# Patient Record
Sex: Male | Born: 1975 | Race: White | Hispanic: No | Marital: Single | Smoking: Current every day smoker
Health system: Southern US, Community
[De-identification: ages and names within clinical notes are randomized; demographics above are authoritative.]

## PROBLEM LIST (undated history)

## (undated) ENCOUNTER — Ambulatory Visit: Source: Home / Self Care

## (undated) DIAGNOSIS — M069 Rheumatoid arthritis, unspecified: Secondary | ICD-10-CM

## (undated) DIAGNOSIS — I499 Cardiac arrhythmia, unspecified: Secondary | ICD-10-CM

---

## 2008-12-03 ENCOUNTER — Emergency Department (HOSPITAL_COMMUNITY): Admission: EM | Admit: 2008-12-03 | Discharge: 2008-12-03 | Payer: Self-pay | Admitting: Emergency Medicine

## 2010-04-28 ENCOUNTER — Emergency Department (HOSPITAL_COMMUNITY): Admission: EM | Admit: 2010-04-28 | Discharge: 2010-04-28 | Payer: Self-pay | Admitting: Emergency Medicine

## 2011-10-27 ENCOUNTER — Emergency Department (HOSPITAL_COMMUNITY): Payer: Self-pay

## 2011-10-27 ENCOUNTER — Encounter (HOSPITAL_COMMUNITY): Payer: Self-pay

## 2011-10-27 ENCOUNTER — Emergency Department (HOSPITAL_COMMUNITY)
Admission: EM | Admit: 2011-10-27 | Discharge: 2011-10-27 | Disposition: A | Discharge status: Home / Self Care | Payer: Self-pay | Attending: Emergency Medicine | Admitting: Emergency Medicine

## 2011-10-27 DIAGNOSIS — IMO0001 Reserved for inherently not codable concepts without codable children: Secondary | ICD-10-CM | POA: Insufficient documentation

## 2011-10-27 DIAGNOSIS — B349 Viral infection, unspecified: Secondary | ICD-10-CM

## 2011-10-27 DIAGNOSIS — F17200 Nicotine dependence, unspecified, uncomplicated: Secondary | ICD-10-CM | POA: Insufficient documentation

## 2011-10-27 DIAGNOSIS — R5381 Other malaise: Secondary | ICD-10-CM | POA: Insufficient documentation

## 2011-10-27 DIAGNOSIS — J069 Acute upper respiratory infection, unspecified: Secondary | ICD-10-CM | POA: Insufficient documentation

## 2011-10-27 DIAGNOSIS — J984 Other disorders of lung: Secondary | ICD-10-CM | POA: Insufficient documentation

## 2011-10-27 DIAGNOSIS — B9789 Other viral agents as the cause of diseases classified elsewhere: Secondary | ICD-10-CM | POA: Insufficient documentation

## 2011-10-27 MED ORDER — ACETAMINOPHEN 500 MG PO TABS
1000.0000 mg | ORAL_TABLET | Freq: Once | ORAL | Status: AC
  Administered 2011-10-27: 1000 mg via ORAL
  Filled 2011-10-27: qty 2

## 2011-10-27 MED ORDER — IBUPROFEN 200 MG PO TABS
400.0000 mg | ORAL_TABLET | Freq: Once | ORAL | Status: AC
  Administered 2011-10-27: 400 mg via ORAL
  Filled 2011-10-27: qty 1

## 2011-10-27 NOTE — ED Notes (Signed)
Per ems, pt reports weakness, fever, chills, cough, decreased appetite , cbg 86 per ems.

## 2011-10-27 NOTE — ED Provider Notes (Signed)
History     CSN: [id number]  Arrival date & time 10/27/11  7829   First MD Initiated Contact with Patient 10/27/11 1010      Chief Complaint  Patient presents with  . Cough  . Nasal Congestion    HPI Pt was seen at 1045.  Per pt and family, c/o gradual onset and persistence of constant runny/stuffy nose, sinus congestion, sneezing, chills, and generalized body aches/fatigue forht past past several days.  Denies CP/palpitations, no SOB/wheezing, no abd pain, no N/V/D, no rash, no back pain.    No past medical history on file.  No past surgical history on file.  History  Substance Use Topics  . Smoking status: Current Everyday Smoker  . Smokeless tobacco: Not on file  . Alcohol Use: No    Review of Systems ROS: Statement: All systems negative except as marked or noted in the HPI; Constitutional: Negative for fever and +chills, generalized body aches/fatigue.; ; Eyes: Negative for eye pain, redness and discharge. ; ; ENMT: Negative for ear pain, hoarseness, sore throat, +rhinorrhea, sneezing, nasal congestion, sinus pressure. ; ; Cardiovascular: Negative for chest pain, palpitations, diaphoresis, dyspnea and peripheral edema. ; ; Respiratory: +cough.  Negative for wheezing and stridor. ; ; Gastrointestinal: Negative for nausea, vomiting, diarrhea, abdominal pain, blood in stool, hematemesis, jaundice and rectal bleeding. . ; ; Genitourinary: Negative for dysuria, flank pain and hematuria. ; ; Musculoskeletal: Negative for back pain and neck pain. Negative for swelling and trauma.; ; Skin: Negative for pruritus, rash, abrasions, blisters, bruising and skin lesion.; ; Neuro: Negative for headache, lightheadedness and neck stiffness. Negative for weakness, altered level of consciousness , altered mental status, extremity weakness, paresthesias, involuntary movement, seizure and syncope.      Allergies  Review of patient's allergies indicates no known allergies.  Home Medications    Current Outpatient Rx  Name Route Sig Dispense Refill  . DAYQUIL PO Oral Take 2 capsules by mouth daily as needed. For cold symptoms.      BP 112/72  Pulse 86  Temp(Src) 98.9 F (37.2 C) (Oral)  Resp 16  SpO2 95%  Physical Exam 1050: Physical examination:  Nursing notes reviewed; Vital signs and O2 SAT reviewed;  Constitutional: Well developed, Well nourished, Well hydrated, In no acute distress; Head:  Normocephalic, atraumatic; Eyes: EOMI, PERRL, No scleral icterus; ENMT: TM's clear bilat.  +edemetous nasal turbinates bilat with clear rhinorrhea. Mouth and pharynx normal, Mucous membranes moist; Neck: Supple, Full range of motion, No lymphadenopathy; Cardiovascular: Regular rate and rhythm, No murmur, rub, or gallop; Respiratory: Breath sounds clear & equal bilaterally, No rales, rhonchi, wheezes, or rub, Normal respiratory effort/excursion; Chest: Nontender, Movement normal; Abdomen: Soft, Nontender, Nondistended, Normal bowel sounds; Extremities: Pulses normal, No tenderness, No edema, No calf edema or asymmetry.; Neuro: AA&Ox3, Major CN grossly intact.  No gross focal motor or sensory deficits in extremities.; Skin: Color normal, Warm, Dry, no rash.    ED Course  Procedures   MDM  MDM Reviewed: nursing note and vitals Interpretation: x-ray and CT scan   Dg Chest 2 View 10/27/2011  *RADIOLOGY REPORT*  Clinical Data: Chest pain and shortness of breath.  CHEST - 2 VIEW  Comparison: None  Findings: The cardiac silhouette, mediastinal and hilar contours are within normal limits.  Hyperinflation and bronchitic changes likely related to smoking.  No infiltrates, edema or effusions. There is a vague nodular density noted in the right upper lobe.  CT scan is recommended for further evaluation to exclude  a pulmonary nodule.   A significant pectus deformity is noted accounting for the prominent right infrahilar vessels.  IMPRESSION: Bronchitic type changes, likely related to smoking. No acute  pulmonary findings. Possible right upper lobe pulmonary nodule.  Recommend chest CT for further evaluation.  Original Report Authenticated By: P. Loralie Champagne, M.D.   Ct Chest Wo Contrast 10/27/2011  *RADIOLOGY REPORT*  Clinical Data: Fevers, chills, coughing, sneezing and weakness.  CT CHEST WITHOUT CONTRAST  Technique:  Multidetector CT imaging of the chest was performed following the standard protocol without IV contrast.  Comparison: Chest x-ray 10/27/2011.  Findings:  Mediastinum: Heart size is normal. There is no significant pericardial fluid, thickening or pericardial calcification. Multiple borderline enlarged mediastinal and hilar lymph nodes, however, none of these appear pathologically enlarged.  The esophagus is unremarkable in appearance.  Lungs/Pleura: No suspicious appearing pulmonary nodule or mass is noted in the right apex to account for the perceived abnormality on recent chest x-ray.  In the lateral segment of the right middle lobe there are two small pulmonary nodules on images 30 and 31 of series 3, the largest of which measures up to 4 mm. There are no other larger more suspicious appearing pulmonary nodules or masses identified.  Upper Abdomen: Unremarkable.  Musculoskeletal: There are no aggressive appearing lytic or blastic lesions noted in the visualized portions of the skeleton. Pectus carinatum.  IMPRESSION: 1.  While there are two small pulmonary nodules in the lateral segment of the right middle lobe (largest of which measures 4 mm in diameter), there is no suspicious appearing pulmonary nodule or mass in the right apex to account for the perceived abnormality on the recent chest radiograph. If the patient is at high risk for bronchogenic carcinoma, follow-up chest CT at 1 year is recommended.  If the patient is at low risk, no follow-up is needed.  This recommendation follows the consensus statement: Guidelines for Management of Small Pulmonary Nodules Detected on CT Scans:  A  Statement from the Fleischner Society as published in Radiology 2005; 237:395-400. 2.  No acute findings within the thorax.  Original Report Authenticated By: Florencia Reasons, M.D.     12:47 PM:  Sats remain 98% R/A, has ambulated to the bathroom with easy resps and steady gait.  Appears URI/viral illness at this time, will tx symptomatically.  Pt wants to go home now.  Dx testing d/w pt and family.  Questions answered.  Verb understanding, agreeable to d/c home with outpt f/u.          Laray Anger, DO 10/29/11 1154

## 2011-10-27 NOTE — ED Notes (Signed)
Pt remains 98% RA. EDP in room

## 2011-10-27 NOTE — ED Notes (Signed)
NFA:[id number]<QM> Expected date:10/27/11<BR> Expected time: 9:58 AM<BR> Means of arrival:Ambulance<BR> Comments:<BR> Weakness

## 2011-10-27 NOTE — Discharge Instructions (Signed)
RESOURCE GUIDE  Dental Problems  Patients with Medicaid: [facility]                     [facility] [address]                                           [address] Phone:  [phone]                                                  Phone:  [phone]  If unable to pay or uninsured, contact:  [facility] or [facility] to become qualified for the adult dental clinic.  Chronic Pain Problems Contact [facility]  [phone] Patients need to be referred by their primary care doctor.  Insufficient Money for Medicine Contact [employer]:  call "211" or [facility] 271-5999.  No Primary Care Doctor Call [facility]  832-8000 Other agencies that provide inexpensive medical care    [facility]  [phone]    [facility]  [phone]    [facility]  [phone]    Women's Clinic  [phone]    [facility]  [phone]    [facility]  [phone]  Psychological Services [facility]  [phone] [facility]  [phone] [facility]   800 853-5163 (emergency services 641-4993)  Substance Abuse Resources [facility]  336-882-2125 [facility] 336-784-9470 [facility] 336-285-9073 [facility] 336-845-3988 Residential & Outpatient Substance Abuse Program  800-659-3381  Abuse/Neglect [place] Child Abuse Hotline (336) 641-3795 [place] Child Abuse Hotline 800-378-5315 (After Hours)  Emergency Shelter [facility] (336) 271-5985  Maternity Homes [facility] (336) 275-9566 [facility] (704) 372-4663  MRSA Hotline #:   832-7006    [place] Resources  [facility] [place]     [employer]                          [facility] [address] [place]                       [address]      [address]  [place]                                                [place]                            [place] Phone:  [phone]                                   Phone:  [phone]                 Phone:  [phone]  [facility] Phone:  [phone]    [facility] Child Abuse Hotline 517 440 1266 725-491-3535 (After Hours)    Take over the counter sudafed, as directed on packaging, for the next week.  Use over the counter normal saline nasal spray, as instructed in the Emergency Department, several times per day for the next 2 weeks.  Take over the counter tylenol and ibuprofen, as directed on packaging, as needed for discomfort.  Your CT scan revealed an incidental finding of 2 small nodules in your lung; you will need to call your regular medical doctor to follow this finding with another CT scan in about 1 year.  Call your regular medical doctor on Monday to schedule a follow up appointment this week.  Return to the Emergency Department immediately if worsening.

## 2011-10-27 NOTE — ED Notes (Signed)
Pt arrived to room by ems, ambulatory on scene. Pt reports fever, chills, coughing, sneezing, weakness, generalize aching since yesterday. No fever in room. CBG 86 with EMS. Pt is alert, oriented. Sat 87% RA. Breath sound clear.

## 2012-11-11 ENCOUNTER — Encounter (HOSPITAL_COMMUNITY): Payer: Self-pay | Admitting: Emergency Medicine

## 2012-11-11 ENCOUNTER — Emergency Department (HOSPITAL_COMMUNITY): Payer: Self-pay

## 2012-11-11 ENCOUNTER — Emergency Department (HOSPITAL_COMMUNITY)
Admission: EM | Admit: 2012-11-11 | Discharge: 2012-11-11 | Disposition: A | Discharge status: Home / Self Care | Payer: Self-pay | Attending: Emergency Medicine | Admitting: Emergency Medicine

## 2012-11-11 DIAGNOSIS — M545 Low back pain, unspecified: Secondary | ICD-10-CM | POA: Insufficient documentation

## 2012-11-11 DIAGNOSIS — Z79899 Other long term (current) drug therapy: Secondary | ICD-10-CM | POA: Insufficient documentation

## 2012-11-11 DIAGNOSIS — F17200 Nicotine dependence, unspecified, uncomplicated: Secondary | ICD-10-CM | POA: Insufficient documentation

## 2012-11-11 DIAGNOSIS — M479 Spondylosis, unspecified: Secondary | ICD-10-CM | POA: Insufficient documentation

## 2012-11-11 MED ORDER — IBUPROFEN 800 MG PO TABS: 800.0000 mg | ORAL_TABLET | Freq: Three times a day (TID) | ORAL | Status: DC

## 2012-11-11 MED ORDER — HYDROCODONE-ACETAMINOPHEN 5-325 MG PO TABS
2.0000 | ORAL_TABLET | Freq: Once | ORAL | Status: AC
  Administered 2012-11-11: 2 via ORAL
  Filled 2012-11-11: qty 2

## 2012-11-11 NOTE — ED Notes (Signed)
Pt. Stated, i started having rt. Hip pain on Friday and its like fire to it.

## 2012-11-11 NOTE — ED Provider Notes (Signed)
History     CSN: [id number]  Arrival date & time 11/11/12  4098   First MD Initiated Contact with Patient 11/11/12 [phone]      Chief Complaint  Patient presents with  . Hip Pain    (Consider location/radiation/quality/duration/timing/severity/associated sxs/prior treatment) HPI Comments: This is a 37 year old male, no pertinent past medical history, who presents emergency department with chief complaint of right low back and hip pain. Patient states the pain started last Friday, and have been worsening since. He is tried taking ibuprofen, with little to no relief. Patient states the pain radiates from the center of his back. He states that the pain is 10 out of 10. He describes it as a burning sensation. He denies any mechanism of injury.  The history is provided by the patient. No language interpreter was used.    History reviewed. No pertinent past medical history.  History reviewed. No pertinent past surgical history.  No family history on file.  History  Substance Use Topics  . Smoking status: Current Every Day Smoker  . Smokeless tobacco: Not on file  . Alcohol Use: No      Review of Systems  All other systems reviewed and are negative.    Allergies  Review of patient's allergies indicates no known allergies.  Home Medications   Current Outpatient Rx  Name  Route  Sig  Dispense  Refill  . ibuprofen (ADVIL,MOTRIN) 200 MG tablet   Oral   Take 400 mg by mouth every 6 (six) hours as needed for pain.         . naphazoline-glycerin (CLEAR EYES) 0.012-0.2 % SOLN   Both Eyes   Place 2 drops into both eyes 2 (two) times daily.           BP 112/75  Pulse 75  Temp(Src) 98.2 F (36.8 C)  Resp 16  SpO2 97%  Physical Exam  Nursing note and vitals reviewed. Constitutional: He is oriented to person, place, and time. He appears well-developed and well-nourished.  HENT:  Head: Normocephalic and atraumatic.  Eyes: Conjunctivae and EOM are normal.  Neck:  Normal range of motion.  Cardiovascular: Normal rate.   Pulmonary/Chest: Effort normal.  Abdominal: He exhibits no distension.  Musculoskeletal: Normal range of motion. He exhibits tenderness.  Right-sided lumbar paraspinal muscles tender to palpation, no tenderness to palpation of the right hip, full range of motion and strength  Neurological: He is alert and oriented to person, place, and time.  Skin: Skin is dry.  No rashes or lesions  Psychiatric: He has a normal mood and affect. His behavior is normal. Judgment and thought content normal.    ED Course  Procedures (including critical care time)  No results found for this or any previous visit. Dg Lumbar Spine Complete  11/11/2012  *RADIOLOGY REPORT*  Clinical Data: Hip pain  LUMBAR SPINE - COMPLETE 4+ VIEW  Comparison: None  Findings: Normal alignment of the lumbar spine. The vertebral body heights are well preserved.  There is mild disc space narrowing and ventral endplate spurring identified.  No fractures or subluxations.  IMPRESSION:  1.  Mild spondylosis. 2.  No acute findings.   Original Report Authenticated By: Signa Kell, M.D.       1. Spondylosis   2. Low back pain       MDM  37 year old male with right hip and low back pain. Will evaluate with plain films, treat with pain meds in the ED, and will reevaluate after imaging.  No  acute bony trauma. Mild spondylosis. Will treat with ibuprofen and ice. Patient understands and agrees with the plan. He is stable and ready for discharge. Orthopedic followup if no improvement after a week.       Roxy Horseman, [name] 11/11/12 1519

## 2012-11-11 NOTE — ED Notes (Signed)
Pt c/o rt hip pain that radiates to center of his back.  Pain started Friday afternoon and is increasing.  Pt denies injury.  2 ibuprofen does not help pain.  Pt alert oriented X4

## 2012-11-13 NOTE — ED Provider Notes (Signed)
Medical screening examination/treatment/procedure(s) were performed by non-physician practitioner and as supervising physician I was immediately available for consultation/collaboration.   Jai Steil Y. Savvy Peeters, MD 11/13/12 1139 

## 2014-02-23 ENCOUNTER — Emergency Department (HOSPITAL_COMMUNITY): Payer: Self-pay

## 2014-02-23 ENCOUNTER — Emergency Department (HOSPITAL_COMMUNITY)
Admission: EM | Admit: 2014-02-23 | Discharge: 2014-02-23 | Disposition: A | Discharge status: Home / Self Care | Payer: Self-pay | Attending: Emergency Medicine | Admitting: Emergency Medicine

## 2014-02-23 ENCOUNTER — Encounter (HOSPITAL_COMMUNITY): Payer: Self-pay | Admitting: Emergency Medicine

## 2014-02-23 DIAGNOSIS — Z791 Long term (current) use of non-steroidal anti-inflammatories (NSAID): Secondary | ICD-10-CM | POA: Insufficient documentation

## 2014-02-23 DIAGNOSIS — Z79899 Other long term (current) drug therapy: Secondary | ICD-10-CM | POA: Insufficient documentation

## 2014-02-23 DIAGNOSIS — M79672 Pain in left foot: Secondary | ICD-10-CM

## 2014-02-23 DIAGNOSIS — F17200 Nicotine dependence, unspecified, uncomplicated: Secondary | ICD-10-CM | POA: Insufficient documentation

## 2014-02-23 DIAGNOSIS — M25579 Pain in unspecified ankle and joints of unspecified foot: Secondary | ICD-10-CM | POA: Insufficient documentation

## 2014-02-23 MED ORDER — HYDROCODONE-ACETAMINOPHEN 5-325 MG PO TABS
2.0000 | ORAL_TABLET | Freq: Once | ORAL | Status: AC
  Administered 2014-02-23: 2 via ORAL
  Filled 2014-02-23: qty 2

## 2014-02-23 MED ORDER — ONDANSETRON 8 MG PO TBDP
8.0000 mg | ORAL_TABLET | Freq: Once | ORAL | Status: AC
  Administered 2014-02-23: 8 mg via ORAL
  Filled 2014-02-23: qty 1

## 2014-02-23 MED ORDER — HYDROCODONE-ACETAMINOPHEN 5-325 MG PO TABS: 1.0000 | ORAL_TABLET | Freq: Four times a day (QID) | ORAL | Status: DC | PRN

## 2014-02-23 MED ORDER — ONDANSETRON HCL 4 MG PO TABS: 4.0000 mg | ORAL_TABLET | Freq: Four times a day (QID) | ORAL | Status: DC

## 2014-02-23 NOTE — ED Notes (Signed)
Pt c/o left heel pain x 8 days. States it has gotten worse especially after pressure has been applied to heel when standing and he lefts it up. Pt denies injury.

## 2014-02-23 NOTE — Progress Notes (Signed)
[facility] CL provided pt with a list of primary care resources, highlighting [facility]. CL also, provided pt with information on area shelters.

## 2014-02-23 NOTE — ED Provider Notes (Signed)
CSN: [id number]     Arrival date & time 02/23/14  1121 History  This chart was scribed for non-physician practitioner Junious SilkHannah Kamry Faraci, working with Dagmar HaitWilliam Blair Walden, MD by Carl Bestelina Holson, ED Scribe. This patient was seen in room WTR7/WTR7 and the patient's care was started at 1:03 PM.    Chief Complaint  Patient presents with  . Foot Pain    left heel    Patient is a 38 y.o. male presenting with lower extremity pain. The history is provided by the patient. No language interpreter was used.  Foot Pain   HPI Comments: Donald Rhodes is a 38 y.o. male who presents to the Emergency Department complaining of constant, stabbing, hot pain located in the heel of his left foot that started 8 days ago.  He states that applying pressure to his left heel aggravates the pain.  He denies any recent injury or experiencing these symptoms in the past.  He denies experiencing any other symptoms.  The patient denies having a history of DM or taking any medications on a daily basis.   History reviewed. No pertinent past medical history. History reviewed. No pertinent past surgical history. No family history on file. History  Substance Use Topics  . Smoking status: Current Every Day Smoker  . Smokeless tobacco: Not on file  . Alcohol Use: No    Review of Systems  Musculoskeletal: Positive for arthralgias and gait problem. Negative for joint swelling.  All other systems reviewed and are negative.     Allergies  Review of patient's allergies indicates no known allergies.  Home Medications   Prior to Admission medications   Medication Sig Start Date End Date Taking? Authorizing Provider  ibuprofen (ADVIL,MOTRIN) 200 MG tablet Take 400 mg by mouth every 6 (six) hours as needed for pain.    Historical Provider, MD  ibuprofen (ADVIL,MOTRIN) 800 MG tablet Take 1 tablet (800 mg total) by mouth 3 (three) times daily. 11/11/12   Roxy Horsemanobert Browning, [name]  naphazoline-glycerin (CLEAR EYES) 0.012-0.2 % SOLN  Place 2 drops into both eyes 2 (two) times daily.    Historical Provider, MD   Triage Vitals: BP 129/88  Pulse 70  Temp(Src) 97.6 F (36.4 C) (Oral)  Resp 20  SpO2 99%  Physical Exam  Nursing note and vitals reviewed. Constitutional: He is oriented to person, place, and time. He appears well-developed and well-nourished.  HENT:  Head: Normocephalic and atraumatic.  Eyes: EOM are normal.  Neck: Normal range of motion.  Cardiovascular: Normal rate, regular rhythm and normal heart sounds.  Exam reveals no gallop and no friction rub.   No murmur heard. Pulmonary/Chest: Effort normal and breath sounds normal. No respiratory distress. He has no wheezes. He has no rales.  Musculoskeletal: Normal range of motion.  Tender to palpation over plantar aspect of left heel.  No erythema, induration, fluctuance, or swelling.  No bruising.    Neurological: He is alert and oriented to person, place, and time.  Skin: Skin is warm and dry.  Psychiatric: He has a normal mood and affect. His behavior is normal.    ED Course  Procedures (including critical care time)  DIAGNOSTIC STUDIES: Oxygen Saturation is 99% on room air, normal by my interpretation.    COORDINATION OF CARE: 1:04 PM- Discussed obtaining an x-ray of the patient's left foot to rule out a clinical suspicion of a bone spur and administering pain medication in the ED.  The patient agreed to the treatment plan.    Labs  Review Labs Reviewed - No data to display  Imaging Review Dg Foot Complete Left  02/23/2014   CLINICAL DATA:  Sharp left heel pain for 1 week.  No known injury.  EXAM: LEFT FOOT - COMPLETE 3+ VIEW  COMPARISON:  None.  FINDINGS: Imaged bones, joints and soft tissues appear normal.  IMPRESSION: Negative exam.   Electronically Signed   By: Donald Kannerhomas  Dalessio M.D.   On: 02/23/2014 13:38     EKG Interpretation None      MDM   Final diagnoses:  Left foot pain    Patient presents to ED with left heel pain x 8 days.  No signs of infection. Sensation intact. No trauma. XR negative for acute pathology. Patient has point tenderness to plantar aspect of heel. Will give stretches for plantar fasciitis. Follow up with PCP. Discussed reasons to return to ED. Vital signs stable for discharge. Patient / Family / Caregiver informed of clinical course, understand medical decision-making process, and agree with plan.   I personally performed the services described in this documentation, which was scribed in my presence. The recorded information has been reviewed and is accurate.    Mora BellmanHannah S Mertice Uffelman, [name] 02/25/14 [phone]

## 2014-02-23 NOTE — Discharge Instructions (Signed)

## 2014-02-26 NOTE — ED Provider Notes (Signed)
Medical screening examination/treatment/procedure(s) were performed by non-physician practitioner and as supervising physician I was immediately available for consultation/collaboration.   EKG Interpretation None        William Rashaun Wichert, MD 02/26/14 0002 

## 2014-07-29 ENCOUNTER — Emergency Department (HOSPITAL_COMMUNITY): Payer: Self-pay

## 2014-07-29 ENCOUNTER — Encounter (HOSPITAL_COMMUNITY): Payer: Self-pay | Admitting: Emergency Medicine

## 2014-07-29 ENCOUNTER — Emergency Department (HOSPITAL_COMMUNITY)
Admission: EM | Admit: 2014-07-29 | Discharge: 2014-07-29 | Disposition: A | Discharge status: Home / Self Care | Payer: Self-pay | Attending: Emergency Medicine | Admitting: Emergency Medicine

## 2014-07-29 DIAGNOSIS — R091 Pleurisy: Secondary | ICD-10-CM

## 2014-07-29 DIAGNOSIS — Z79899 Other long term (current) drug therapy: Secondary | ICD-10-CM | POA: Insufficient documentation

## 2014-07-29 DIAGNOSIS — J069 Acute upper respiratory infection, unspecified: Secondary | ICD-10-CM | POA: Insufficient documentation

## 2014-07-29 DIAGNOSIS — J40 Bronchitis, not specified as acute or chronic: Secondary | ICD-10-CM

## 2014-07-29 DIAGNOSIS — Z720 Tobacco use: Secondary | ICD-10-CM | POA: Insufficient documentation

## 2014-07-29 DIAGNOSIS — Z8679 Personal history of other diseases of the circulatory system: Secondary | ICD-10-CM | POA: Insufficient documentation

## 2014-07-29 DIAGNOSIS — J209 Acute bronchitis, unspecified: Secondary | ICD-10-CM | POA: Insufficient documentation

## 2014-07-29 HISTORY — DX: Cardiac arrhythmia, unspecified: I49.9

## 2014-07-29 LAB — CBC WITH DIFFERENTIAL/PLATELET
BASOS ABS: 0.1 10*3/uL (ref 0.0–0.1)
Basophils Relative: 1 % (ref 0–1)
EOS ABS: 0.2 10*3/uL (ref 0.0–0.7)
EOS PCT: 4 % (ref 0–5)
HCT: 46.2 % (ref 39.0–52.0)
Hemoglobin: 15.9 g/dL (ref 13.0–17.0)
Lymphocytes Relative: 38 % (ref 12–46)
Lymphs Abs: 2.1 10*3/uL (ref 0.7–4.0)
MCH: 32 pg (ref 26.0–34.0)
MCHC: 34.4 g/dL (ref 30.0–36.0)
MCV: 93 fL (ref 78.0–100.0)
Monocytes Absolute: 0.6 10*3/uL (ref 0.1–1.0)
Monocytes Relative: 11 % (ref 3–12)
Neutro Abs: 2.6 10*3/uL (ref 1.7–7.7)
Neutrophils Relative %: 46 % (ref 43–77)
PLATELETS: 190 10*3/uL (ref 150–400)
RBC: 4.97 MIL/uL (ref 4.22–5.81)
RDW: 13 % (ref 11.5–15.5)
WBC: 5.4 10*3/uL (ref 4.0–10.5)

## 2014-07-29 LAB — BASIC METABOLIC PANEL
Anion gap: 12 (ref 5–15)
BUN: 13 mg/dL (ref 6–23)
CALCIUM: 9.5 mg/dL (ref 8.4–10.5)
CO2: 22 mEq/L (ref 19–32)
Chloride: 104 mEq/L (ref 96–112)
Creatinine, Ser: 0.8 mg/dL (ref 0.50–1.35)
GFR calc Af Amer: >90 mL/min (ref >90)
GLUCOSE: 83 mg/dL (ref 70–99)
Potassium: 4.4 mEq/L (ref 3.7–5.3)
SODIUM: 138 meq/L (ref 137–147)

## 2014-07-29 LAB — PRO B NATRIURETIC PEPTIDE: PRO B NATRI PEPTIDE: 24.3 pg/mL (ref 0–125)

## 2014-07-29 MED ORDER — MORPHINE SULFATE 4 MG/ML IJ SOLN
4.0000 mg | Freq: Once | INTRAMUSCULAR | Status: AC
  Administered 2014-07-29: 4 mg via INTRAVENOUS
  Filled 2014-07-29: qty 1

## 2014-07-29 MED ORDER — IBUPROFEN 800 MG PO TABS: 800.0000 mg | ORAL_TABLET | Freq: Three times a day (TID) | ORAL | Status: DC | PRN

## 2014-07-29 MED ORDER — KETOROLAC TROMETHAMINE 30 MG/ML IJ SOLN
30.0000 mg | Freq: Once | INTRAMUSCULAR | Status: AC
  Administered 2014-07-29: 30 mg via INTRAVENOUS
  Filled 2014-07-29: qty 1

## 2014-07-29 MED ORDER — PREDNISONE 20 MG PO TABS
60.0000 mg | ORAL_TABLET | Freq: Once | ORAL | Status: AC
  Administered 2014-07-29: 60 mg via ORAL
  Filled 2014-07-29: qty 3

## 2014-07-29 MED ORDER — HYDROCODONE-ACETAMINOPHEN 5-325 MG PO TABS: 1.0000 | ORAL_TABLET | ORAL | Status: DC | PRN

## 2014-07-29 MED ORDER — IPRATROPIUM BROMIDE 0.02 % IN SOLN
0.5000 mg | Freq: Once | RESPIRATORY_TRACT | Status: AC
  Administered 2014-07-29: 0.5 mg via RESPIRATORY_TRACT
  Filled 2014-07-29: qty 2.5

## 2014-07-29 MED ORDER — ALBUTEROL SULFATE HFA 108 (90 BASE) MCG/ACT IN AERS
2.0000 | INHALATION_SPRAY | Freq: Once | RESPIRATORY_TRACT | Status: AC
  Administered 2014-07-29: 2 via RESPIRATORY_TRACT
  Filled 2014-07-29: qty 6.7

## 2014-07-29 MED ORDER — PREDNISONE (PAK) 10 MG PO TABS: ORAL_TABLET | Freq: Every day | ORAL | Status: DC

## 2014-07-29 MED ORDER — ALBUTEROL SULFATE (2.5 MG/3ML) 0.083% IN NEBU
5.0000 mg | INHALATION_SOLUTION | Freq: Once | RESPIRATORY_TRACT | Status: AC
  Administered 2014-07-29: 5 mg via RESPIRATORY_TRACT
  Filled 2014-07-29: qty 6

## 2014-07-29 NOTE — Discharge Instructions (Signed)
Read the information below.  Use the prescribed medication as directed.  Please discuss all new medications with your pharmacist.  Do not take additional tylenol while taking the prescribed pain medication to avoid overdose.  You may return to the Emergency Department at any time for worsening condition or any new symptoms that concern you.  If you develop worsening chest pain, shortness of breath, fever, you pass out, or become weak or dizzy, return to the ER for a recheck.       How to Use an Inhaler Using your inhaler correctly is very important. Good technique will make sure that the medicine reaches your lungs.  HOW TO USE AN INHALER:  Take the cap off the inhaler.  If this is the first time using your inhaler, you need to prime it. Shake the inhaler for 5 seconds. Release four puffs into the air, away from your face. Ask your doctor for help if you have questions.  Shake the inhaler for 5 seconds.  Turn the inhaler so the bottle is above the mouthpiece.  Put your pointer finger on top of the bottle. Your thumb holds the bottom of the inhaler.  Open your mouth.  Either hold the inhaler away from your mouth (the width of 2 fingers) or place your lips tightly around the mouthpiece. Ask your doctor which way to use your inhaler.  Breathe out as much air as possible.  Breathe in and push down on the bottle 1 time to release the medicine. You will feel the medicine go in your mouth and throat.  Continue to take a deep breath in very slowly. Try to fill your lungs.  After you have breathed in completely, hold your breath for 10 seconds. This will help the medicine to settle in your lungs. If you cannot hold your breath for 10 seconds, hold it for as long as you can before you breathe out.  Breathe out slowly, through pursed lips. Whistling is an example of pursed lips.  If your doctor has told you to take more than 1 puff, wait at least 15-30 seconds between puffs. This will help you  get the best results from your medicine. Do not use the inhaler more than your doctor tells you to.  Put the cap back on the inhaler.  Follow the directions from your doctor or from the inhaler package about cleaning the inhaler. If you use more than one inhaler, ask your doctor which inhalers to use and what order to use them in. Ask your doctor to help you figure out when you will need to refill your inhaler.  If you use a steroid inhaler, always rinse your mouth with water after your last puff, gargle and spit out the water. Do not swallow the water. GET HELP IF:  The inhaler medicine only partially helps to stop wheezing or shortness of breath.  You are having trouble using your inhaler.  You have some increase in thick spit (phlegm). GET HELP RIGHT AWAY IF:  The inhaler medicine does not help your wheezing or shortness of breath or you have tightness in your chest.  You have dizziness, headaches, or fast heart rate.  You have chills, fever, or night sweats.  You have a large increase of thick spit, or your thick spit is bloody. MAKE SURE YOU:   Understand these instructions.  Will watch your condition.  Will get help right away if you are not doing well or get worse. Document Released: 05/15/2008 Document Revised: 05/27/2013  Document Reviewed: 03/05/2013 [facility] Patient Information 2015 [place], [place]. This information is not intended to replace advice given to you by your health care provider. Make sure you discuss any questions you have with your health care provider.  Pleurisy Pleurisy is redness, puffiness (swelling), and soreness (inflammation) of the lining of the lungs. It can be hard to breathe and hurt to breathe. Coughing or deep breathing will make it hurt more. It is often caused by an existing infection or disease.  HOME CARE  Only take medicine as told by your doctor.  Only take antibiotic medicine as directed. Make sure to finish it even if you start to  feel better. GET HELP RIGHT AWAY IF:   Your lips, fingernails, or toenails are blue or dark.  You cough up blood.  You have a hard time breathing.  Your pain is not controlled with medicine or it lasts for more than 1 week.  Your pain spreads (radiates) into your neck, arms, or jaw.  You are short of breath or wheezing.  You develop a fever, rash, throw up (vomit), or faint. MAKE SURE YOU:   Understand these instructions.  Will watch your condition.  Will get help right away if you are not doing well or get worse. Document Released: 07/19/2008 Document Revised: 04/08/2013 Document Reviewed: 01/18/2013 [facility] Patient Information 2015 [place], [place]. This information is not intended to replace advice given to you by your health care provider. Make sure you discuss any questions you have with your health care provider.   Emergency Department Resource Guide 1) Find a Doctor and Pay Out of Pocket Although you won't have to find out who is covered by your insurance plan, it is a good idea to ask around and get recommendations. You will then need to call the office and see if the doctor you have chosen will accept you as a new patient and what types of options they offer for patients who are self-pay. Some doctors offer discounts or will set up payment plans for their patients who do not have insurance, but you will need to ask so you aren't surprised when you get to your appointment.  2) Contact Your Local Health Department Not all health departments have doctors that can see patients for sick visits, but many do, so it is worth a call to see if yours does. If you don't know where your local health department is, you can check in your phone book. The CDC also has a tool to help you locate your state's health department, and many state websites also have listings of all of their local health departments.  3) Find a Walk-in Clinic If your illness is not likely to be very severe or  complicated, you may want to try a walk in clinic. These are popping up all over the country in pharmacies, drugstores, and shopping centers. They're usually staffed by nurse practitioners or physician assistants that have been trained to treat common illnesses and complaints. They're usually fairly quick and inexpensive. However, if you have serious medical issues or chronic medical problems, these are probably not your best option.  No Primary Care Doctor: - Call Health Connect at  [phone] - they can help you locate a primary care doctor that  accepts your insurance, provides certain services, etc. - Physician Referral Service- [phone]  Chronic Pain Problems: Organization         Address  Phone   Notes  [facility]  [phone] Patients need  to be referred by their primary care doctor.   Medication Assistance: Organization         Address  Phone   Notes  [facility] [address] [place], [place] [address] [phone] --Must be a resident of [facility] [place] [facility] [place] -- Must have NO insurance coverage whatsoever (no Medicaid/ Medicare, etc.) -- The pt. MUST have a primary care doctor that directs their care regularly and follows them in the community   MedAssist  [phone]) [phone]   [employer]  [phone]    Agencies that provide inexpensive medical care: Organization         Address  Phone   Notes  [facility]  [phone]) [phone]   [facility]    [phone]   [facility] [address] [place], [place] [address] [phone]   [facility] [place] [address], [place] [phone]) [phone]   Planned Parenthood    [phone]   [facility]    [phone]   Community Health and [facility]  [address], [place] Phone:  [phone], Fax:  [phone] Hours of Operation:  9 am - 6 pm, M-F.  Also accepts  Medicaid/Medicare and self-pay.  [facility]  [address], [place] Phone: [phone](336) [phone], Fax: [phone]. Hours of Operation:  8:30 am - 5:30 pm, M-F.  Also accepts Medicaid and self-pay.  [facility] [place] [address], [place] Phone: [phone](336) [phone]   [facility] [address], [place], [place] [phone], Ext. [phone] Mondays & Thursdays: 7-9 AM.  First 15 patients are seen on a first come, first serve basis.    Medicaid-accepting [facility] Providers:  Organization         Address  Phone   Notes  [facility] [address], [place] [phone]) [phone] Also accepts self-pay patients.  [facility] [address], [place]  [phone]) [phone]   [facility] [address], [place] [phone]) [phone]   [facility] [address] [place] [address], [place] [phone]) [phone]   Renaye RakersVeita Bland [address], [place]   [phone]) [phone] Only accepts [place] Access [place] patients after they have their name applied to their card.   Self-Pay (no insurance) in [facility]:  Organization         Address  Phone   Notes  Sickle Cell Patients, [facility] [address], [place] [phone]) [phone]   [facility] [address], [place] [phone]) [phone]   [facility] [place]  [address], [place] [phone]) [phone]   [facility]/Dr. Osei-Bonsu  [address] [place] [address], [place] or [phone], [place] [phone] Phone number for both [place] and [place] locations is the same.  [facility] [address], [place] [phone]) [phone]   [facility] [address] [place] [address], [place] or [address] [phone]) [phone]   [facility] [address], [place] [phone])  [phone], phone; [phone]) [phone], fax Sees patients 1st and 3rd Saturday of every month.  Must not qualify for public or private insurance (i.e. Medicaid, Medicare, [place] Health Choice, Veterans' Benefits)  Household income should  be no more than 200% of the poverty level The clinic cannot treat you if you are pregnant or think you are pregnant  Sexually transmitted diseases are not treated at the clinic.    Dental Care: Organization         Address  Phone  Notes  [facility] Department of [facility] [address] [place], [place] [phone] Accepts children up to age 54 who are enrolled in [place] or [place] Health Choice; pregnant women with a Medicaid card; and children who have applied for Medicaid or [place] Health Choice, but were declined, whose parents can pay a reduced fee at time of service.  [facility] Department of [facility]  [address], [place] [phone] Accepts children up to age 45 who are enrolled in [place] or [employer] Health Choice; pregnant women with a Medicaid card; and children who have applied for Medicaid or [place] Health Choice, but were declined, whose parents can pay a reduced fee at time of service.  [facility]  [address] [place], [place] [phone] Patients are seen by appointment only. Walk-ins are not accepted. [facility] will see patients 35 years of age and older. Monday - Tuesday (8am-5pm) Most Wednesdays (8:30-5pm) $30 per visit, cash only  [facility] Adult [facility] Access PROGRAM  [address], [facility] [phone] Patients are seen by appointment only. Walk-ins are not accepted. [facility] will see patients 52 years of age and older. One Wednesday Evening (Monthly: Volunteer Based).  $30 per visit, cash only  [employer]  [phone] for adults; Children under age 49, call Graduate Pediatric Dentistry at [phone]. Children aged  34-14, please call [phone] to request a pediatric application.  Dental services are provided in all areas of dental care including fillings, crowns and bridges, complete and partial dentures, implants, gum treatment, root canals, and extractions. Preventive care is also provided. Treatment is provided to both adults and children. Patients are selected via a lottery and there is often a waiting list.   [facility] [address] [place]  [phone] [email or web address]   [facility] [address] [place], [place] [phone], Ext. [phone] Second and Fourth Thursday of each month, opens at 6:30 AM; Clinic ends at 9 AM.  Patients are seen on a first-come first-served basis, and a limited number are seen during each clinic.   [facility]  [address] [place], [place] [phone]   Eligibility Requirements You must have lived in [place], [place], or [place] counties for at least the last three months.   You cannot be eligible for state or federal sponsored [place], including [employer], [place], or [employer].   You generally cannot be eligible for healthcare insurance through your employer.    How to apply: Eligibility screenings are held every Tuesday and Wednesday afternoon from 1:00 pm until 4:00 pm. You do not need an appointment for the interview!  [facility] [address] [place], [place] [phone]   [facility] Health Department  [phone]   [facility] Health Department  [phone]   [facility] Health Department  [phone]    Behavioral Health Resources in the Community: Intensive Outpatient Programs Organization         Address  Phone  Notes  [facility] [address], [place], [place] [phone]  [facility] Outpatient [address], [place], [place] [phone]   [facility] [address],  [place], [place]  [phone]   [facility] Mental Health [address]  [place], [place] [phone] or [phone]   Substance Abuse Resources Organization         Address  Phone  Notes  Al[facility] Drug Services  [phone]   [facility]  [phone]   [facility] [place]  [phone]   Floydene Flock  [phone]   Residential & Outpatient Substance Abuse Program  [phone]   Psychological Services Organization         Address  Phone  Notes  [facility] Behavioral Health  [phone]   [facility]  [phone]   [facility] Mental Health [address], [place] [phone] or [phone]    Mobile Crisis Teams Organization         Address  Phone  Notes  [facility], Mobile Crisis Care Unit  [phone]   [facility]  [address] [place], [place] [phone]   Doristine Locks [address] [place] [phone]    Self-Help/Support Groups Organization         Address  Phone             Notes  [facility] Assoc[facility] of [place] - variety of support groups  [phone]- I7437963 Call for more information  Narcotics Anonymous (NA), [facility] [address], [employer] [place]  2 meetings at this location   Statistician         Address  Phone  Notes  [facility] [address],    [place]  [phone]   [facility]  [address], [place] [address], [place], [place] [phone]   [facility] [address] [place], [place] [phone] Admissions: 8am-3pm M-F  [facility] [address]    [place], [place] [phone]   [facility] [address] [place], [place], [place] [phone]   [facility] [address],  [place], [place] [phone]   [facility] - Intensive Outpatient [address], [place], [place] [phone]   [facility]  (Addiction Recovery Care [facility].) [address]  [place], [place] [phone] or [phone]   [facility] ([facility]) [address], [place], [place] [phone] Accepts Medicaid  [facility] [address],  [place] [phone] Substance Abuse/Addiction Treatment   [facility] Organization         Address  Phone  Notes  [facility]  [phone]   Angie Fava, PhD [address] [place], [place]   [phone] or [phone]   [facility]   [address] [place], [place] [phone]   [facility] [address], [place], [place] [phone] Insurance/Medicaid/sponsorship through [facility] and Families [address]                                    [place], [place] [phone] Therapy/tele-psych/case  [facility] [address], [place] [phone]    Dr. Lolly Mustache  [phone]   [facility]  [employer] [facility] Dept. 1) [address], [place] 2) [address], [place] 3)  [address], [place] [phone]  [phone]   [facility] Child Abuse Hotline [phone] or 9160287915 (After Hours)

## 2014-07-29 NOTE — ED Provider Notes (Signed)
CSN: [id number]     Arrival date & time 07/29/14  1023 History   First MD Initiated Contact with Patient 07/29/14 1044     Chief Complaint  Patient presents with  . Cough  . URI     (Consider location/radiation/quality/duration/timing/severity/associated sxs/prior Treatment) HPI   Patient with hx smoking p/w 3 weeks of gradually worsening cough productive of white sputum, SOB, and now 4-5 days of right sided pleuritic chest pain.  The cough and SOB are worst overnight and pt states he feels like he is trying to breathe through a coffee stirrer every morning.  States he feels like he is trying to breathe through a straw right now.  Pain in his right anterior chest is 10/10 intensity, occurs only with deep inspiration, is sharp, does not radiate.  He feels that there is no air moving through this portion of his lungs.  Denies fevers, URI symptoms, leg swelling, palpitations, orthopnea.  Denies recent immobilization, personal or family hx of blood clots.  He is a daily smoker but is convinced his breathing problem is coming from his [facility] where he lives, which is black and rusty.  No personal or family hx of CAD.    Past Medical History  Diagnosis Date  . Irregular heart beat    No past surgical history on file. No family history on file. History  Substance Use Topics  . Smoking status: Current Every Day Smoker -- 0.50 packs/day    Types: Cigars  . Smokeless tobacco: Not on file  . Alcohol Use: No    Review of Systems  All other systems reviewed and are negative.     Allergies  Review of patient's allergies indicates no known allergies.  Home Medications   Prior to Admission medications   Medication Sig Start Date End Date Taking? Authorizing Provider  acetaminophen (TYLENOL) 500 MG tablet Take 500 mg by mouth every 6 (six) hours as needed for moderate pain.    Historical Provider, MD  HYDROcodone-acetaminophen (NORCO/VICODIN) 5-325 MG per tablet Take 1 tablet by  mouth every 6 (six) hours as needed for moderate pain or severe pain. 02/23/14   Mora BellmanHannah S Merrell, [name]  ondansetron (ZOFRAN) 4 MG tablet Take 1 tablet (4 mg total) by mouth every 6 (six) hours. 02/23/14   Ramon DredgeHannah S Merrell, [name]   BP 106/70 mmHg  Pulse 74  Temp(Src) 98 F (36.7 C) (Oral)  Resp 20  SpO2 98% Physical Exam  Constitutional: He appears well-developed and well-nourished. No distress.  HENT:  Head: Normocephalic and atraumatic.  Eyes: Conjunctivae are normal.  Neck: Neck supple.  Cardiovascular: Normal rate and regular rhythm.   Pulmonary/Chest: Effort normal. No accessory muscle usage or stridor. No tachypnea. No respiratory distress. He has no decreased breath sounds. He has no wheezes. He has no rhonchi. He has no rales. He exhibits no tenderness.  Abdominal: Soft. He exhibits no distension and no mass. There is no tenderness. There is no rebound and no guarding.  Musculoskeletal: He exhibits no edema.  Neurological: He is alert. He exhibits normal muscle tone.  Skin: He is not diaphoretic.  Nursing note and vitals reviewed.   ED Course  Procedures (including critical care time) Labs Review Labs Reviewed  BASIC METABOLIC PANEL  CBC WITH DIFFERENTIAL  PRO B NATRIURETIC PEPTIDE    Imaging Review Dg Chest 2 View  07/29/2014   CLINICAL DATA:  Cough  EXAM: CHEST  2 VIEW  COMPARISON:  10/27/2011  FINDINGS: The heart size  and mediastinal contours are within normal limits. Both lungs are clear. Sternal deformity unchanged.  IMPRESSION: No active cardiopulmonary disease.   Electronically Signed   By: Marlan Palauharles  Clark M.D.   On: 07/29/2014 12:35     EKG Interpretation   Date/Time:  Thursday July 29 2014 11:41:08 EST Ventricular Rate:  76 PR Interval:  145 QRS Duration: 95 QT Interval:  394 QTC Calculation: 443 R Axis:   66 Text Interpretation:  Sinus rhythm Probable left atrial enlargement No old  tracing to compare Confirmed by BELFI  MD, MELANIE ([id number]) on  07/29/2014  1:28:51 PM       PERC negative Wells' criteria for PE zero.   MDM   Final diagnoses:  Bronchitis  Pleurisy    Afebrile, nontoxic patient with hx smoking p/w 3 weeks of productive cough and new pleuritic chest pain.  No risk factors for pulmonary embolism.  CXR negative.  Labs normal.  Pt improved with neb treatment and pain medication.  O2 saturation normal.    D/C home with albuterol, ibuprofen, norco, PCP resources for follow up.  Discussed return precautions.  Discussed result, findings, treatment, and follow up  with patient.  Pt given return precautions.  Pt verbalizes understanding and agrees with plan.        Trixie Dredgemily Martie Muhlbauer, [name] 07/29/14 1359  Rolan BuccoMelanie Belfi, MD 07/29/14 [phone]

## 2014-07-29 NOTE — ED Notes (Addendum)
Pt states has been coughing up white phlegm for 3 weeks, having pain on right side of chest with inspiration-- states "there is nothing going on in there"   [facility] and states that the air conditioning unit is rusty -- thinks that is why he has this cough.

## 2014-07-29 NOTE — ED Notes (Addendum)
Pt ambulated with no complaints of sob, dizziness, or pain. Oxygen saturation maintained on RA between 95-100%. NAD.

## 2014-08-05 ENCOUNTER — Emergency Department (HOSPITAL_COMMUNITY): Payer: Self-pay

## 2014-08-05 ENCOUNTER — Emergency Department (HOSPITAL_COMMUNITY)
Admission: EM | Admit: 2014-08-05 | Discharge: 2014-08-05 | Disposition: A | Discharge status: Home / Self Care | Payer: Self-pay | Attending: Emergency Medicine | Admitting: Emergency Medicine

## 2014-08-05 ENCOUNTER — Encounter (HOSPITAL_COMMUNITY): Payer: Self-pay

## 2014-08-05 DIAGNOSIS — R112 Nausea with vomiting, unspecified: Secondary | ICD-10-CM | POA: Insufficient documentation

## 2014-08-05 DIAGNOSIS — M940 Chondrocostal junction syndrome [Tietze]: Secondary | ICD-10-CM | POA: Insufficient documentation

## 2014-08-05 DIAGNOSIS — Z720 Tobacco use: Secondary | ICD-10-CM | POA: Insufficient documentation

## 2014-08-05 DIAGNOSIS — R05 Cough: Secondary | ICD-10-CM | POA: Insufficient documentation

## 2014-08-05 DIAGNOSIS — Z7952 Long term (current) use of systemic steroids: Secondary | ICD-10-CM | POA: Insufficient documentation

## 2014-08-05 DIAGNOSIS — Z8709 Personal history of other diseases of the respiratory system: Secondary | ICD-10-CM | POA: Insufficient documentation

## 2014-08-05 DIAGNOSIS — Z8679 Personal history of other diseases of the circulatory system: Secondary | ICD-10-CM | POA: Insufficient documentation

## 2014-08-05 LAB — CBC WITH DIFFERENTIAL/PLATELET
Basophils Absolute: 0 10*3/uL (ref 0.0–0.1)
Basophils Relative: 0 % (ref 0–1)
Eosinophils Absolute: 0.1 10*3/uL (ref 0.0–0.7)
Eosinophils Relative: 1 % (ref 0–5)
HCT: 46.1 % (ref 39.0–52.0)
Hemoglobin: 15.6 g/dL (ref 13.0–17.0)
Lymphocytes Relative: 15 % (ref 12–46)
Lymphs Abs: 1.8 10*3/uL (ref 0.7–4.0)
MCH: 30.8 pg (ref 26.0–34.0)
MCHC: 33.8 g/dL (ref 30.0–36.0)
MCV: 90.9 fL (ref 78.0–100.0)
Monocytes Absolute: 1 10*3/uL (ref 0.1–1.0)
Monocytes Relative: 9 % (ref 3–12)
Neutro Abs: 9.1 10*3/uL — ABNORMAL HIGH (ref 1.7–7.7)
Neutrophils Relative %: 75 % (ref 43–77)
Platelets: 235 10*3/uL (ref 150–400)
RBC: 5.07 MIL/uL (ref 4.22–5.81)
RDW: 13 % (ref 11.5–15.5)
WBC: 12 10*3/uL — ABNORMAL HIGH (ref 4.0–10.5)

## 2014-08-05 LAB — URINALYSIS, ROUTINE W REFLEX MICROSCOPIC
Bilirubin Urine: NEGATIVE
Glucose, UA: NEGATIVE mg/dL
Hgb urine dipstick: NEGATIVE
Ketones, ur: NEGATIVE mg/dL
Nitrite: NEGATIVE
Protein, ur: 30 mg/dL — AB
Specific Gravity, Urine: 1.019 (ref 1.005–1.030)
Urobilinogen, UA: 1 mg/dL (ref 0.0–1.0)
pH: 8.5 — ABNORMAL HIGH (ref 5.0–8.0)

## 2014-08-05 LAB — COMPREHENSIVE METABOLIC PANEL
ALT: 38 U/L (ref 0–53)
AST: 15 U/L (ref 0–37)
Albumin: 4 g/dL (ref 3.5–5.2)
Alkaline Phosphatase: 58 U/L (ref 39–117)
Anion gap: 13 (ref 5–15)
BUN: 19 mg/dL (ref 6–23)
CO2: 24 mEq/L (ref 19–32)
Calcium: 9.7 mg/dL (ref 8.4–10.5)
Chloride: 104 mEq/L (ref 96–112)
Creatinine, Ser: 0.76 mg/dL (ref 0.50–1.35)
GFR calc Af Amer: >90 mL/min (ref >90)
GFR calc non Af Amer: >90 mL/min (ref >90)
Glucose, Bld: 117 mg/dL — ABNORMAL HIGH (ref 70–99)
Potassium: 4.4 mEq/L (ref 3.7–5.3)
Sodium: 141 mEq/L (ref 137–147)
Total Bilirubin: 0.5 mg/dL (ref 0.3–1.2)
Total Protein: 6.7 g/dL (ref 6.0–8.3)

## 2014-08-05 LAB — URINE MICROSCOPIC-ADD ON

## 2014-08-05 MED ORDER — DIPHENHYDRAMINE HCL 50 MG/ML IJ SOLN
25.0000 mg | Freq: Once | INTRAMUSCULAR | Status: AC
  Administered 2014-08-05: 25 mg via INTRAVENOUS
  Filled 2014-08-05: qty 1

## 2014-08-05 MED ORDER — SODIUM CHLORIDE 0.9 % IV SOLN
1000.0000 mL | Freq: Once | INTRAVENOUS | Status: AC
  Administered 2014-08-05: 1000 mL via INTRAVENOUS

## 2014-08-05 MED ORDER — METOCLOPRAMIDE HCL 5 MG/ML IJ SOLN
10.0000 mg | Freq: Once | INTRAMUSCULAR | Status: AC
  Administered 2014-08-05: 10 mg via INTRAVENOUS
  Filled 2014-08-05: qty 2

## 2014-08-05 MED ORDER — ONDANSETRON HCL 4 MG/2ML IJ SOLN
4.0000 mg | Freq: Once | INTRAMUSCULAR | Status: AC
  Administered 2014-08-05: 4 mg via INTRAVENOUS
  Filled 2014-08-05: qty 2

## 2014-08-05 MED ORDER — KETOROLAC TROMETHAMINE 30 MG/ML IJ SOLN
30.0000 mg | Freq: Once | INTRAMUSCULAR | Status: AC
  Administered 2014-08-05: 30 mg via INTRAVENOUS
  Filled 2014-08-05: qty 1

## 2014-08-05 MED ORDER — NAPROXEN 500 MG PO TABS: 500.0000 mg | ORAL_TABLET | Freq: Two times a day (BID) | ORAL | Status: DC

## 2014-08-05 NOTE — ED Provider Notes (Signed)
38 year old male presents with right-sided chest pain along with what he describes as nausea and vomiting several times over the last 24 hours, recently prescribed prednisone, Vicodin, albuterol inhaler for a upper respiratory illness thought to be bronchitis, negative x-ray, improved with albuterol in the hospital prior to this. He presents today with right-sided chest pain which is tender to palpation in the right anterolateral chest wall, no signs of overlying rash, no deformity, normal lung sounds, normal x-ray today. We'll recommend stopping his medications including prednisone and Vicodin which may be making him sick, continue with anti-inflammatory and when necessary albuterol. Discussed with the patient agrees with the plan. He appears well for discharge, very low risk for pulmonary embolism, normal vital signs.  Medical screening examination/treatment/procedure(s) were conducted as a shared visit with non-physician practitioner(s) and myself.  I personally evaluated the patient during the encounter.  Clinical Impression:   Final diagnoses:  Costochondritis  Non-intractable vomiting with nausea, vomiting of unspecified type         Vida RollerBrian D Kyra Laffey, MD 08/07/14 1755

## 2014-08-05 NOTE — ED Notes (Signed)
Patient transported to X-ray 

## 2014-08-05 NOTE — ED Notes (Signed)
Per Ems pt called due to n/v x 3 days; Pt states continues to have chest pain in right and left chest from a week ago which he states he was seen here for; pt c/o 9/10 on arrival; Pt A&O.

## 2014-08-05 NOTE — Discharge Instructions (Signed)
Please follow the directions provided.  Be sure to stop taking the prednisone and the vicodin as they are contributing to your nausea and vomiting.  Please take the naproxen twice a day for the pain in your ribs.  Follow-up with the primary care referral provided  To help make sure you are getting better.  There is a resource guide also provided to help find a primary care doctor.  Don't hesitate to return for new, worsening or concerning symptoms.    SEEK IMMEDIATE MEDICAL CARE IF:  Your pain increases or you are very uncomfortable.  You have shortness of breath or difficulty breathing.  You cough up blood.  You have worse chest pains, sweating, or vomiting.  You have a fever or persistent symptoms for more than 2-3 days.  You have a fever and your symptoms suddenly get worse.    Emergency Department Resource Guide 1) Find a Doctor and Pay Out of Pocket Although you won't have to find out who is covered by your insurance plan, it is a good idea to ask around and get recommendations. You will then need to call the office and see if the doctor you have chosen will accept you as a new patient and what types of options they offer for patients who are self-pay. Some doctors offer discounts or will set up payment plans for their patients who do not have insurance, but you will need to ask so you aren't surprised when you get to your appointment.  2) Contact Your Local Health Department Not all health departments have doctors that can see patients for sick visits, but many do, so it is worth a call to see if yours does. If you don't know where your local health department is, you can check in your phone book. The CDC also has a tool to help you locate your state's health department, and many state websites also have listings of all of their local health departments.  3) Find a Walk-in Clinic If your illness is not likely to be very severe or complicated, you may want to try a walk in clinic. These are  popping up all over the country in pharmacies, drugstores, and shopping centers. They're usually staffed by nurse practitioners or physician assistants that have been trained to treat common illnesses and complaints. They're usually fairly quick and inexpensive. However, if you have serious medical issues or chronic medical problems, these are probably not your best option.  No Primary Care Doctor: - Call Health Connect at  [phone] - they can help you locate a primary care doctor that  accepts your insurance, provides certain services, etc. - Physician Referral Service- [phone]  Chronic Pain Problems: Organization         Address  Phone   Notes  [facility]  [phone] Patients need to be referred by their primary care doctor.   Medication Assistance: Organization         Address  Phone   Notes  [facility]ication [facility] [address] [place], [place] [address] [phone]) [phone] --Must be a resident of [facility] [place] -- Must have NO insurance coverage whatsoever (no Medicaid/ Medicare, etc.) -- The pt. MUST have a primary care doctor that directs their care regularly and follows them in the community   MedAssist  [phone]) [phone]   [employer]  [phone]) [phone]    Agencies that provide inexpensive medical care: Organization         Address  Phone   Notes  [facility]  [phone]   [facility]    [phone]) [phone]   [facility] [address] [place], [place] [address] [phone]) [phone]   [facility] [place] [address], [place] [phone]) [phone]   [facility]anned Parenthood    [phone](336) [phone]   [facility]    [phone]) [phone]   [facility]  [address], [place] Phone:  [phone]) [phone], Fax:  [phone] Hours of Operation:  9 am - 6 pm, M-F.  Also accepts Medicaid/Medicare and self-pay.  [facility]  [address], [place] Phone: [phone]) [phone], Fax: [phone]) [phone]. Hours of Operation:  8:30 am - 5:30 pm, M-F.  Also accepts Medicaid and self-pay.  [facility] [place] [address], [place] Phone: [phone](336) [phone]   [facility] [address], [place], [place] [phone], Ext. [phone] Mondays & Thursdays: 7-9 AM.  First 15 patients are seen on a first come, first serve basis.    Medicaid-accepting [facility] Providers:  Organization         Address  Phone   Notes  [facility] [address], [place] [phone]) [phone] Also accepts self-pay patients.  [facility] [address], [place]  [phone]) [phone]   [facility] [address], [place] [phone]) [phone]   [facility] [address] [place] [address], [place] [phone]) [phone]   Renaye RakersVeita Bland [address], [place]   [phone] Only accepts [place] Access [place] patients after they have their name applied to their card.   Self-Pay (no insurance) in [facility]:  Organization         Address  Phone   Notes  Sickle Cell Patients, [facility] [address], [place] [phone]) [phone]   [facility] [address], [place] [phone]) [phone]   [facility] [place]  [address],  [phone]   [facility]/Dr. Osei-Bonsu  [address] [place] [address], [place] or [phone], [place] [phone] Phone number for both [place] and [place] locations is the same.  [facility] [address], [place] [phone]) [phone]   [facility] [place] [address] [place] [address], [place] or [address] [phone]) [phone]   [facility] [address], [place] [phone], phone; [phone]) [phone], fax Sees patients 1st and 3rd Saturday  of every month.  Must not qualify for public or private insurance (i.e. Medicaid, Medicare, [place] Health Choice, Veterans' Benefits)  Household income should be no more than 200% of the poverty level The clinic cannot treat you if you are pregnant or think you are pregnant  Sexually transmitted diseases are not treated at the clinic.    Dental Care: Organization         Address  Phone  Notes  [facility] [address], [place] [phone]) [phone] Accepts children up to age 36 who are enrolled in [place] or [place] Health Choice; pregnant women with a Medicaid card; and children who have applied for Medicaid or [place] Health Choice, but were declined, whose parents can pay a reduced fee at time of service.  [facility]  [address], [place] [phone] Accepts children up to age 43 who are enrolled in [place] or [place] Health Choice; pregnant women with a Medicaid card; and children who have applied for Medicaid or [place] Health Choice, but were declined, whose parents can pay a reduced fee at time of service.  [facility]  [address] [place], [place] [phone] Patients are seen by appointment only. Walk-ins are not accepted. [facility] will see patients 16 years of age and older. Monday - Tuesday (8am-5pm) Most Wednesdays (8:30-5pm) $30 per visit, cash only  [facility] Access PROGRAM  [address], [facility] [phone] Patients are seen by appointment only. Walk-ins are not accepted. [facility] will see patients 62 years of age and older. One Wednesday Evening (Monthly: Volunteer Based).  $30 per visit, cash only  [employer]  [phone] for adults; Children under age 93, call Graduate Pediatric Dentistry at [phone]. Children aged 16-14, please call [phone] to request a pediatric application.   Dental services are provided in all areas of dental care including fillings, crowns and bridges, complete and partial dentures, implants, gum treatment, root canals, and extractions. Preventive care is also provided. Treatment is provided to both adults and children. Patients are selected via a lottery and there is often a waiting list.   [facility] [address], [place]  [phone] [email or web address]   [facility] [address] [place], [place] [phone], Ext. [phone] Second and Fourth Thursday of each month, opens at 6:30 AM; Clinic ends at 9 AM.  Patients are seen on a first-come first-served basis, and a limited number are seen during each clinic.   [facility]  [address] [place] [place], [place] [phone]   Eligibility Requirements You must have lived in [place], [place], or [place] counties for at least the last three months.   You cannot be eligible for state or federal sponsored [place], including [employer], [place], or [employer].   You generally cannot be eligible for healthcare insurance through your employer.    How to apply: Eligibility screenings are held every Tuesday and Wednesday afternoon from 1:00 pm until 4:00 pm. You do not need an appointment for the interview!  [facility] [address] [place], [place] [phone]   [facility] Health Department  [phone]   [facility] Health Department  [phone]   [facility] Department  [phone]    Behavioral Health Resources in the Community: Intensive Outpatient Programs Organization         Address  Phone  Notes  [facility] [address] [place], [place] [phone]   [facility]patient [address] [place], [place] [phone]   [facility] [address] [place], [place]  [phone]   [facility] [address],  [place], [place] [phone] or [phone]   Substance Abuse Resources Organization         Address  Phone  Notes  Alcohol and Drug Services  [phone]   [facility]  [phone]   [facility] [place]  [phone]   Floydene Flock  [phone]   Residential & Outpatient Substance Abuse Program  [phone]   Psychological Services Organization         Address  Phone  Notes  [facility] Behavioral Health  [phone]   [facility] Services  [phone]   [facility] Mental Health [address] [place] [phone] or [phone]    Mobile Crisis Teams Organization         Address  Phone  Notes  [facility], Mobile Crisis Care Unit  [phone]   [facility]  [address] [place], [place] [phone]   Doristine Locks [address] [place] [phone]    Self-Help/Support Groups Organization         Address  Phone             Notes  [facility]oc[facility] of [place] - variety of support groups  336- I7437963 Call for more information  Narcotics Anonymous (NA), [facility] [address], [employer] [place]  2 meetings at this location   Statistician         Address  Phone  Notes  [facility] [address],    [place]  [phone]   [facility]  [address] [place] [address], [place], [place] [phone]   [facility] [address] [place], [place] [phone] Admissions: 8am-3pm M-F  [facility] [address],    [place], [place] [phone]   [facility] [address] [place], [place], [place] [phone]   [facility] [address],  [place], [place] [phone]   [facility] - Intensive Outpatient [address], [place], [place] [phone]   [facility] [address]  [place], [place]  [phone] or [phone]   [facility] ([facility]) [address], [place], [place] [phone] Accepts Medicaid  [facility] [address]  [place] [phone] Substance Abuse/Addiction Treatment   [facility] Organization         Address  Phone  Notes  [facility]  [phone]   Angie Fava, PhD [address] [place], [place]   [phone] or [phone]   [facility] Behavioral   [address] [place], [place] [phone]   [facility] [address], [place], [place] [phone] Insurance/Medicaid/sponsorship through [facility] and Families [address]                                    [place], [place] [phone] Therapy/tele-psych/case  [facility] [address], [place] [phone]    Dr. Lolly Mustache  [phone]   [facility] [place]  [employer] [facility] Dept. 1) [address], [place] 2) [address] [place] 3)  [address], [place] [phone]  [phone]   [facility] Child Abuse Hotline 6163968202 or [phone] (After Hours)

## 2014-08-05 NOTE — ED Provider Notes (Signed)
CSN: [id number]     Arrival date & time 08/05/14  [id number] History   First MD Initiated Contact with Patient 08/05/14 [phone]     Chief Complaint  Patient presents with  . Emesis  . Chest Pain    (Consider location/radiation/quality/duration/timing/severity/associated sxs/prior Treatment) HPI  Donald Rhodes is a 38 yo male presenting with report of bilat rib pain since being diagnosed with bronchitis 1 week ago,  He reports the pain is is worse in the right anterior ribs than the left and worse when he takes a deep breath.  He reports the coughing and shortness of breath have improved since being on the inhaler.  He also reports some nausea and vomiting x 3 days.  He has vomited several times each day and notes the nausea is worse after taking vicodin.  He denies any fever, chills, recent surgery, immobilization, leg pain or swelling, hemoptysis or bloody/bilious emesis.   Past Medical History  Diagnosis Date  . Irregular heart beat    History reviewed. No pertinent past surgical history. History reviewed. No pertinent family history. History  Substance Use Topics  . Smoking status: Current Every Day Smoker -- 0.50 packs/day    Types: Cigars  . Smokeless tobacco: Not on file  . Alcohol Use: No    Review of Systems  Constitutional: Negative for fever and chills.  HENT: Negative for sore throat.   Eyes: Negative for visual disturbance.  Respiratory: Positive for cough. Negative for shortness of breath.   Cardiovascular: Negative for chest pain and leg swelling.  Gastrointestinal: Positive for nausea and vomiting. Negative for diarrhea.  Genitourinary: Negative for dysuria.  Musculoskeletal: Positive for myalgias.  Skin: Negative for rash.  Neurological: Negative for weakness, numbness and headaches.    Allergies  Review of patient's allergies indicates no known allergies.  Home Medications   Prior to Admission medications   Medication Sig Start Date End Date Taking?  Authorizing Provider  HYDROcodone-acetaminophen (NORCO/VICODIN) 5-325 MG per tablet Take 1 tablet by mouth every 4 (four) hours as needed for moderate pain or severe pain. 07/29/14   Trixie DredgeEmily West, [name]  ibuprofen (ADVIL,MOTRIN) 800 MG tablet Take 1 tablet (800 mg total) by mouth every 8 (eight) hours as needed for mild pain or moderate pain. 07/29/14   Trixie DredgeEmily West, [name]  Ibuprofen-Diphenhydramine Cit (ADVIL PM PO) Take 1 tablet by mouth at bedtime.    Historical Provider, MD  predniSONE (STERAPRED UNI-PAK) 10 MG tablet Take by mouth daily. Day 1: take 6 tabs.  Day 2: 5 tabs  Day 3: 4 tabs  Day 4: 3 tabs  Day 5: 2 tabs  Day 6: 1 tab 07/29/14   Trixie DredgeEmily West, [name]   BP 122/72 mmHg  Pulse 71  Temp(Src) 98.1 F (36.7 C) (Oral)  Resp 15  Ht 5\' 6"  (1.676 m)  Wt 125 lb (56.7 kg)  BMI 20.19 kg/m2  SpO2 99% Physical Exam  Constitutional: He is oriented to person, place, and time. He appears well-developed and well-nourished. No distress.  HENT:  Head: Normocephalic and atraumatic.  Mouth/Throat: Oropharynx is clear and moist. No oropharyngeal exudate.  Eyes: Conjunctivae are normal.  Neck: Neck supple. No thyromegaly present.  Cardiovascular: Normal rate, regular rhythm and intact distal pulses.  Exam reveals no gallop and no friction rub.   No murmur heard. Pulmonary/Chest: Effort normal and breath sounds normal. No tachypnea. No respiratory distress. He has no decreased breath sounds. He has no wheezes. He has no rhonchi. He has no rales.  He exhibits tenderness.    Abdominal: Soft. He exhibits no distension and no mass. There is no tenderness. There is no rebound and no guarding.  Musculoskeletal: He exhibits no tenderness.  Lymphadenopathy:    He has no cervical adenopathy.  Neurological: He is alert and oriented to person, place, and time.  Skin: Skin is warm and dry. No rash noted. He is not diaphoretic.  Psychiatric: He has a normal mood and affect.  Nursing note and vitals reviewed.   ED  Course  Procedures (including critical care time) Labs Review Labs Reviewed  CBC WITH DIFFERENTIAL - Abnormal; Notable for the following:    WBC 12.0 (*)    Neutro Abs 9.1 (*)    All other components within normal limits  COMPREHENSIVE METABOLIC PANEL - Abnormal; Notable for the following:    Glucose, Bld 117 (*)    All other components within normal limits  URINALYSIS, ROUTINE W REFLEX MICROSCOPIC - Abnormal; Notable for the following:    APPearance CLOUDY (*)    pH 8.5 (*)    Protein, ur 30 (*)    Leukocytes, UA SMALL (*)    All other components within normal limits  URINE MICROSCOPIC-ADD ON    Imaging Review Dg Chest 2 View  08/05/2014   CLINICAL DATA:  Emesis and low chest pain.  EXAM: CHEST  2 VIEW  COMPARISON:  07/29/2014  FINDINGS: Normal heart size and mediastinal contours. No pneumomediastinum. No acute infiltrate or edema. No effusion or pneumothorax. Pectus excavatum. No acute osseous findings.  IMPRESSION: No active cardiopulmonary disease.   Electronically Signed   By: Tiburcio PeaJonathan  Watts M.D.   On: 08/05/2014 06:07     EKG Interpretation   Date/Time:  Thursday August 05 2014 05:20:59 EST Ventricular Rate:  68 PR Interval:  144 QRS Duration: 87 QT Interval:  365 QTC Calculation: 388 R Axis:   80 Text Interpretation:  -Sinus rhythm Borderline T wave abnormalities since  last tracing no significant change Confirmed by Juleen ChinaKOHUT  MD, STEPHEN ([id number])  on 08/05/2014 6:01:38 AM      MDM   Final diagnoses:  Costochondritis  Non-intractable vomiting with nausea, vomiting of unspecified type   38 yo with bilat rib pain, worse on right then left after diagnosis of bronchitis last week. Brief episode of vomiting worse after taking vicodin. CBC, CMP, UA shows mild leukocytosis most likely reactive to a viral illness but no other significant abnormality.  Pt's CXR negative for infiltrate or acute finding.  He has no abd tenderness and reproducible tenderness to palpation over  right and left anterior ribs.  Discussed with pt the diagnosis of costochondritis due to coughing and the use of NSAIDS to treat.  Also he should avoid any further prednisone or vicodin to alleviate his nausea and vomiting.  Pt is well-appearing, in no acute distress and vital signs are stable.  He appears safe to be discharged.  Discharge include follow-up with their PCP.  Return precautions provided.  He is aware of plan and in agreement.  Filed Vitals:   08/05/14 0519 08/05/14 0720 08/05/14 0730 08/05/14 0836  BP:  111/69 115/80 104/60  Pulse:   67   Temp:    98.1 F (36.7 C)  TempSrc:    Oral  Resp:  22 23 26   Height:      Weight:      SpO2: 99% 98% 94% 98%   Meds given in ED:  Medications  ondansetron (ZOFRAN) injection 4 mg (4 mg Intravenous  Given 08/05/14 0546)  0.9 %  sodium chloride infusion (0 mLs Intravenous Stopped 08/05/14 0623)  metoCLOPramide (REGLAN) injection 10 mg (10 mg Intravenous Given 08/05/14 0733)  diphenhydrAMINE (BENADRYL) injection 25 mg (25 mg Intravenous Given 08/05/14 0730)  ketorolac (TORADOL) 30 MG/ML injection 30 mg (30 mg Intravenous Given 08/05/14 0732)    Discharge Medication List as of 08/05/2014  7:57 AM    START taking these medications   Details  naproxen (NAPROSYN) 500 MG tablet Take 1 tablet (500 mg total) by mouth 2 (two) times daily with a meal., Starting 08/05/2014, Until Discontinued, Print            Harle Battiest, NP 08/07/14 1610  Vida Roller, MD 08/07/14 1755

## 2014-08-05 NOTE — ED Notes (Addendum)
Pt. Drinking gingerale. Tolerating without any difficulty

## 2015-11-15 ENCOUNTER — Emergency Department (HOSPITAL_COMMUNITY)
Admission: EM | Admit: 2015-11-15 | Discharge: 2015-11-15 | Disposition: A | Discharge status: Home / Self Care | Payer: Self-pay | Attending: Emergency Medicine | Admitting: Emergency Medicine

## 2015-11-15 ENCOUNTER — Encounter (HOSPITAL_COMMUNITY): Payer: Self-pay | Admitting: Family Medicine

## 2015-11-15 ENCOUNTER — Emergency Department (HOSPITAL_COMMUNITY): Payer: Self-pay

## 2015-11-15 DIAGNOSIS — F17200 Nicotine dependence, unspecified, uncomplicated: Secondary | ICD-10-CM | POA: Insufficient documentation

## 2015-11-15 DIAGNOSIS — R110 Nausea: Secondary | ICD-10-CM | POA: Insufficient documentation

## 2015-11-15 DIAGNOSIS — J069 Acute upper respiratory infection, unspecified: Secondary | ICD-10-CM | POA: Insufficient documentation

## 2015-11-15 DIAGNOSIS — Z79899 Other long term (current) drug therapy: Secondary | ICD-10-CM | POA: Insufficient documentation

## 2015-11-15 DIAGNOSIS — Z8679 Personal history of other diseases of the circulatory system: Secondary | ICD-10-CM | POA: Insufficient documentation

## 2015-11-15 LAB — CBC
HEMATOCRIT: 45 % (ref 39.0–52.0)
HEMOGLOBIN: 15.1 g/dL (ref 13.0–17.0)
MCH: 31.5 pg (ref 26.0–34.0)
MCHC: 33.6 g/dL (ref 30.0–36.0)
MCV: 93.9 fL (ref 78.0–100.0)
Platelets: 210 10*3/uL (ref 150–400)
RBC: 4.79 MIL/uL (ref 4.22–5.81)
RDW: 13.8 % (ref 11.5–15.5)
WBC: 7.5 10*3/uL (ref 4.0–10.5)

## 2015-11-15 LAB — BASIC METABOLIC PANEL
ANION GAP: 9 (ref 5–15)
BUN: 12 mg/dL (ref 6–20)
CALCIUM: 9.4 mg/dL (ref 8.9–10.3)
CO2: 22 mmol/L (ref 22–32)
Chloride: 107 mmol/L (ref 101–111)
Creatinine, Ser: 0.71 mg/dL (ref 0.61–1.24)
GFR calc Af Amer: >60 mL/min (ref >60)
GLUCOSE: 78 mg/dL (ref 65–99)
POTASSIUM: 4.2 mmol/L (ref 3.5–5.1)
SODIUM: 138 mmol/L (ref 135–145)

## 2015-11-15 LAB — I-STAT TROPONIN, ED: TROPONIN I, POC: 0 ng/mL (ref 0.00–0.08)

## 2015-11-15 MED ORDER — HYDROCOD POLST-CPM POLST ER 10-8 MG/5ML PO SUER: 5.0000 mL | Freq: Every evening | ORAL | Status: DC | PRN

## 2015-11-15 MED ORDER — BENZONATATE 100 MG PO CAPS: 100.0000 mg | ORAL_CAPSULE | Freq: Three times a day (TID) | ORAL | Status: DC

## 2015-11-15 MED ORDER — ALBUTEROL SULFATE HFA 108 (90 BASE) MCG/ACT IN AERS
2.0000 | INHALATION_SPRAY | Freq: Once | RESPIRATORY_TRACT | Status: AC
  Administered 2015-11-15: 2 via RESPIRATORY_TRACT
  Filled 2015-11-15: qty 6.7

## 2015-11-15 NOTE — ED Notes (Signed)
Pt here for cough, chest pain and SOB. sts coughing up green phlegm. sts also he has been having blood areas pop up on him.

## 2015-11-15 NOTE — ED Provider Notes (Signed)
CSN: [id number]     Arrival date & time 11/15/15  1034 History  By signing my name below, I, Iona Beard, attest that this documentation has been prepared under the direction and in the presence of Celita Aron C. Turner Baillie, [name].  Electronically Signed: Iona Beard, ED Scribe 11/15/2015 at 6:49 PM.   Chief Complaint  Patient presents with  . Cough  . Chest Pain  . Shortness of Breath     The history is provided by the patient. No language interpreter was used.    HPI Comments: Donald Rhodes is a 40 y.o. male who presents to the Emergency Department complaining of gradual onset, productive cough with yellow mucous, ongoing for two days. Pt reports associated nasal congestion, shortness of breath, mild, non-radiating central chest pain, and nausea. He reports chest pain is worsened with cough and deep inspiration. No other associated symptoms noted. No other worsening or alleviating factors noted. Pt denies fever, sore throat, vomiting, abdominal pain, leg swelling, hx of cancer, hx of blood clots, or any other pertinent symptoms. No recent travel or recent surgeries.    Past Medical History  Diagnosis Date  . Irregular heart beat    History reviewed. No pertinent past surgical history. History reviewed. No pertinent family history. Social History  Substance Use Topics  . Smoking status: Current Every Day Smoker -- 0.50 packs/day    Types: Cigars  . Smokeless tobacco: None  . Alcohol Use: No      Review of Systems  Constitutional: Positive for chills. Negative for fever.  HENT: Positive for congestion.   Respiratory: Positive for cough and shortness of breath.   Cardiovascular: Positive for chest pain. Negative for leg swelling.  Gastrointestinal: Positive for nausea. Negative for vomiting and abdominal pain.     Allergies  Review of patient's allergies indicates no known allergies.  Home Medications   Prior to Admission medications   Medication Sig Start Date  End Date Taking? Authorizing Provider  benzonatate (TESSALON) 100 MG capsule Take 1 capsule (100 mg total) by mouth every 8 (eight) hours. 11/15/15   Mady Gemma, [name]  chlorpheniramine-HYDROcodone (TUSSIONEX PENNKINETIC ER) 10-8 MG/5ML SUER Take 5 mLs by mouth at bedtime as needed for cough. 11/15/15   Mady Gemma, [name]  HYDROcodone-acetaminophen (NORCO/VICODIN) 5-325 MG per tablet Take 1 tablet by mouth every 4 (four) hours as needed for moderate pain or severe pain. 07/29/14   Trixie Dredge, [name]  ibuprofen (ADVIL,MOTRIN) 800 MG tablet Take 1 tablet (800 mg total) by mouth every 8 (eight) hours as needed for mild pain or moderate pain. 07/29/14   Trixie Dredge, [name]  naproxen (NAPROSYN) 500 MG tablet Take 1 tablet (500 mg total) by mouth 2 (two) times daily with a meal. 08/05/14   Harle Battiest, NP  predniSONE (STERAPRED UNI-PAK) 10 MG tablet Take by mouth daily. Day 1: take 6 tabs.  Day 2: 5 tabs  Day 3: 4 tabs  Day 4: 3 tabs  Day 5: 2 tabs  Day 6: 1 tab 07/29/14   Trixie Dredge, [name]    BP 106/63 mmHg  Pulse 69  Temp(Src) 98.3 F (36.8 C)  Resp 18  SpO2 99% Physical Exam  Constitutional: He is oriented to person, place, and time. He appears well-developed and well-nourished. No distress.  HENT:  Head: Normocephalic and atraumatic.  Right Ear: External ear normal.  Left Ear: External ear normal.  Nose: Nose normal.  Mouth/Throat: Uvula is midline, oropharynx is clear and moist and mucous membranes  are normal. No oropharyngeal exudate, posterior oropharyngeal edema, posterior oropharyngeal erythema or tonsillar abscesses.  Eyes: Conjunctivae, EOM and lids are normal. Pupils are equal, round, and reactive to light. Right eye exhibits no discharge. Left eye exhibits no discharge. No scleral icterus.  Neck: Normal range of motion. Neck supple.  Cardiovascular: Normal rate, regular rhythm, normal heart sounds, intact distal pulses and normal pulses.   Pulmonary/Chest: Effort  normal and breath sounds normal. No respiratory distress. He has no wheezes. He has no rales. He exhibits no tenderness.  Abdominal: Soft. Normal appearance and bowel sounds are normal. He exhibits no distension and no mass. There is no tenderness. There is no rigidity, no rebound and no guarding.  Musculoskeletal: Normal range of motion. He exhibits no edema or tenderness.  Neurological: He is alert and oriented to person, place, and time.  Skin: Skin is warm, dry and intact. No rash noted. He is not diaphoretic. No erythema. No pallor.  Psychiatric: He has a normal mood and affect. His speech is normal and behavior is normal.  Nursing note and vitals reviewed.   ED Course  Procedures (including critical care time) DIAGNOSTIC STUDIES: Oxygen Saturation is 99% on RA, normal by my interpretation.    COORDINATION OF CARE: 6:38 PM-Discussed treatment plan which includes  CXR, CBC, I-stat troponin, and EKG with pt at bedside and pt agreed to plan.    Labs Review Labs Reviewed  BASIC METABOLIC PANEL  CBC  I-STAT TROPOININ, ED    Imaging Review Dg Chest 2 View  11/15/2015  CLINICAL DATA:  Chest pain, shortness of breath and cough for 3 days. Initial encounter. EXAM: CHEST  2 VIEW COMPARISON:  PA and lateral chest 08/05/2014 and 10/27/2011. CT chest 10/27/2011. FINDINGS: The lungs are clear. Heart size is normal. There is no pneumothorax or pleural effusion. Pectus excavatum deformity noted. IMPRESSION: No acute disease. Electronically Signed   By: Drusilla Kannerhomas  Dalessio M.D.   On: 11/15/2015 12:58   I have personally reviewed and evaluated these images and lab results as part of my medical decision-making.   EKG Interpretation None      MDM   Final diagnoses:  URI (upper respiratory infection)    40 year old male presents with nasal congestion and productive cough x 2 days. Also notes associated chest pain and shortness of breath. Patient is afebrile. Vital signs stable. No tachycardia,  tachypnea, or hypoxia. No erythema, edema, or exudate to posterior oropharynx. Heart RRR. Lungs clear to auscultation bilaterally. No increased work of breathing or respiratory distress. Abdomen soft, non-tender, non-distended. No lower extremity edema.  CBC negative for leukocytosis or anemia. BMP unremarkable. EKG sinus rhythm, HR 72. Troponin negative. CXR no acute disease.  Low suspicion for ACS given duration of time since symptom onset and negative work-up in the ED. Patient is PERC negative. Symptoms likely due to viral URI. Will give albuterol inhaler and cough medicine for home and advised to increase fluid intake and to try warm honey, tea, and throat lozenges for additional symptom relief. Patient is non-toxic and well-appearing, feel he is stable for discharge at this time. Patient to follow-up with PCP. Strict return precautions discussed. Patient verbalizes his understanding and is in agreement with plan.  BP 106/63 mmHg  Pulse 69  Temp(Src) 98.3 F (36.8 C)  Resp 18  SpO2 99%  I personally performed the services described in this documentation, which was scribed in my presence. The recorded information has been reviewed and is accurate.  Mady Gemma, [name] 11/15/15 1850  Gerhard Munch, MD 11/15/15 2018

## 2015-11-15 NOTE — Discharge Instructions (Signed)
1. Medications: tessalon for cough during the day, tussionex for cough at bedtime, usual home medications 2. Treatment: rest, drink plenty of fluids; try warm honey, tea, and throat lozenges for additional symptom relief  3. Follow Up: please followup with your primary doctor for discussion of your diagnoses and further evaluation after today's visit; if you do not have a primary care doctor use the phone number listed in your discharge paperwork to find one; please return to the ER for high fever, severe chest pain or shortness of breath, new or worsening symptoms   Upper Respiratory Infection, Adult Most upper respiratory infections (URIs) are caused by a virus. A URI affects the nose, throat, and upper air passages. The most common type of URI is often called "the common cold." HOME CARE   Take medicines only as told by your doctor.  Gargle warm saltwater or take cough drops to comfort your throat as told by your doctor.  Use a warm mist humidifier or inhale steam from a shower to increase air moisture. This may make it easier to breathe.  Drink enough fluid to keep your pee (urine) clear or pale yellow.  Eat soups and other clear broths.  Have a healthy diet.  Rest as needed.  Go back to work when your fever is gone or your doctor says it is okay.  You may need to stay home longer to avoid giving your URI to others.  You can also wear a face mask and wash your hands often to prevent spread of the virus.  Use your inhaler more if you have asthma.  Do not use any tobacco products, including cigarettes, chewing tobacco, or electronic cigarettes. If you need help quitting, ask your doctor. GET HELP IF:  You are getting worse, not better.  Your symptoms are not helped by medicine.  You have chills.  You are getting more short of breath.  You have brown or red mucus.  You have yellow or brown discharge from your nose.  You have pain in your face, especially when you bend  forward.  You have a fever.  You have puffy (swollen) neck glands.  You have pain while swallowing.  You have white areas in the back of your throat. GET HELP RIGHT AWAY IF:   You have very bad or constant:  Headache.  Ear pain.  Pain in your forehead, behind your eyes, and over your cheekbones (sinus pain).  Chest pain.  You have long-lasting (chronic) lung disease and any of the following:  Wheezing.  Long-lasting cough.  Coughing up blood.  A change in your usual mucus.  You have a stiff neck.  You have changes in your:  Vision.  Hearing.  Thinking.  Mood. MAKE SURE YOU:   Understand these instructions.  Will watch your condition.  Will get help right away if you are not doing well or get worse.   This information is not intended to replace advice given to you by your health care provider. Make sure you discuss any questions you have with your health care provider.   Document Released: 01/23/2008 Document Revised: 12/21/2014 Document Reviewed: 11/11/2013 Elsevier Interactive Patient Education [employer]2016 [employer] Inc[employer]

## 2016-01-17 DIAGNOSIS — Z139 Encounter for screening, unspecified: Secondary | ICD-10-CM

## 2016-01-27 NOTE — Congregational Nurse Program (Signed)
Congregational Nurse Program Note  Date of Encounter: 01/17/2016  Past Medical History: Past Medical History  Diagnosis Date  . Irregular heart beat     Encounter Details:     CNP Questionnaire - 01/17/16 1108    Patient Demographics   Is this a new or existing patient? New   Patient is considered a/an Not Applicable   Race African-American/Black   Patient Assistance   Location of Patient Assistance Not Applicable   Patient's financial/insurance status Low Income;Self-Pay   Uninsured Patient Yes   Interventions Counseled to make appt. with provider   Patient referred to apply for the following financial assistance [employer] Connects Renewal   Food insecurities addressed Provided food supplies   Transportation assistance No   Assistance securing medications No   Educational Programmer, systemshealth offerings Navigating the healthcare system;Nutrition   Encounter Details   Primary purpose of visit Education/Health Concerns   Was an Emergency Department visit averted? Not Applicable   Does patient have a medical provider? No   Patient referred to Clinic   Was a mental health screening completed? (GAINS tool) No   Does patient have dental issues? No   Does patient have vision issues? No   Does your patient have an abnormal blood pressure today? No   Since previous encounter, have you referred patient for abnormal blood pressure that resulted in a new diagnosis or medication change? No   Does your patient have an abnormal blood glucose today? No   Since previous encounter, have you referred patient for abnormal blood glucose that resulted in a new diagnosis or medication change? No   Was there a life-saving intervention made? No       B/P check  110/70

## 2016-08-08 ENCOUNTER — Encounter (HOSPITAL_COMMUNITY): Payer: Self-pay | Admitting: Emergency Medicine

## 2016-08-08 ENCOUNTER — Emergency Department (HOSPITAL_COMMUNITY): Payer: Self-pay

## 2016-08-08 ENCOUNTER — Emergency Department (HOSPITAL_COMMUNITY)
Admission: EM | Admit: 2016-08-08 | Discharge: 2016-08-08 | Disposition: A | Discharge status: Home / Self Care | Payer: Self-pay | Attending: Emergency Medicine | Admitting: Emergency Medicine

## 2016-08-08 DIAGNOSIS — M25462 Effusion, left knee: Secondary | ICD-10-CM | POA: Insufficient documentation

## 2016-08-08 DIAGNOSIS — F17290 Nicotine dependence, other tobacco product, uncomplicated: Secondary | ICD-10-CM | POA: Insufficient documentation

## 2016-08-08 DIAGNOSIS — W1842XA Slipping, tripping and stumbling without falling due to stepping into hole or opening, initial encounter: Secondary | ICD-10-CM | POA: Insufficient documentation

## 2016-08-08 DIAGNOSIS — Y999 Unspecified external cause status: Secondary | ICD-10-CM | POA: Insufficient documentation

## 2016-08-08 DIAGNOSIS — Y9389 Activity, other specified: Secondary | ICD-10-CM | POA: Insufficient documentation

## 2016-08-08 DIAGNOSIS — M25562 Pain in left knee: Secondary | ICD-10-CM

## 2016-08-08 DIAGNOSIS — Y929 Unspecified place or not applicable: Secondary | ICD-10-CM | POA: Insufficient documentation

## 2016-08-08 MED ORDER — HYDROCODONE-ACETAMINOPHEN 5-325 MG PO TABS: 1.0000 | ORAL_TABLET | Freq: Four times a day (QID) | ORAL | 0 refills | Status: DC | PRN

## 2016-08-08 MED ORDER — IBUPROFEN 800 MG PO TABS: 800.0000 mg | ORAL_TABLET | Freq: Three times a day (TID) | ORAL | 0 refills | Status: DC

## 2016-08-08 NOTE — ED Provider Notes (Signed)
[facility]-EMERGENCY DEPT Provider Note   CSN: [id number] Arrival date & time: 08/08/16  [id number]     History   Chief Complaint Chief Complaint  Patient presents with  . Knee Pain    Left    HPI Donald Rhodes is a 40 y.o. male.  Patient presents to the ED with a chief complaint of left knee pain.  He states that he stepped in a hole and twisted his knee on Sunday.  He states that since then he has had some swelling and clicking of his left knee with pain.  He has taken ibuprofen with good relief.  He denies any fevers or chills.  Denies any other symptoms.   The history is provided by the patient. No language interpreter was used.    Past Medical History:  Diagnosis Date  . Irregular heart beat     There are no active problems to display for this patient.   History reviewed. No pertinent surgical history.     Home Medications    Prior to Admission medications   Medication Sig Start Date End Date Taking? Authorizing Provider  benzonatate (TESSALON) 100 MG capsule Take 1 capsule (100 mg total) by mouth every 8 (eight) hours. Patient not taking: Reported on 08/08/2016 11/15/15   Mady GemmaElizabeth C Westfall, [name]  chlorpheniramine-HYDROcodone [facility](TUSSIONEX PENNKINETIC ER) 10-8 MG/5ML SUER Take 5 mLs by mouth at bedtime as needed for cough. Patient not taking: Reported on 08/08/2016 11/15/15   Mady GemmaElizabeth C Westfall, [name]  HYDROcodone-acetaminophen (NORCO/VICODIN) 5-325 MG tablet Take 1-2 tablets by mouth every 6 (six) hours as needed. 08/08/16   Roxy Horsemanobert Travia Onstad, [name]  ibuprofen (ADVIL,MOTRIN) 800 MG tablet Take 1 tablet (800 mg total) by mouth 3 (three) times daily. 08/08/16   Roxy Horsemanobert Ambriel Gorelick, [name]  naproxen (NAPROSYN) 500 MG tablet Take 1 tablet (500 mg total) by mouth 2 (two) times daily with a meal. Patient not taking: Reported on 08/08/2016 08/05/14   Harle BattiestElizabeth Tysinger, NP  predniSONE (STERAPRED UNI-PAK) 10 MG tablet Take by mouth daily. Day 1: take 6 tabs.  Day 2: 5 tabs  Day 3: 4 tabs   Day 4: 3 tabs  Day 5: 2 tabs  Day 6: 1 tab Patient not taking: Reported on 08/08/2016 07/29/14   Trixie DredgeEmily West, [name]    Family History No family history on file.  Social History Social History  Substance Use Topics  . Smoking status: Current Every Day Smoker    Packs/day: 0.50    Types: Cigars  . Smokeless tobacco: Never Used  . Alcohol use No     Allergies   Patient has no known allergies.   Review of Systems Review of Systems  Musculoskeletal: Positive for arthralgias.  All other systems reviewed and are negative.    Physical Exam Updated Vital Signs BP 123/75 (BP Location: Right Arm)   Pulse 78   Temp 97.7 F (36.5 C) (Oral)   Resp 18   Ht 5\' 6"  (1.676 m)   Wt 56.7 kg   SpO2 98%   BMI 20.18 kg/m   Physical Exam Physical Exam  Constitutional: Pt appears well-developed and well-nourished. No distress.  HENT:  Head: Normocephalic and atraumatic.  Eyes: Conjunctivae are normal.  Neck: Normal range of motion.  Cardiovascular: Normal rate, regular rhythm and intact distal pulses.   Capillary refill < 3 sec  Pulmonary/Chest: Effort normal and breath sounds normal.  Musculoskeletal: Pt exhibits tenderness to palpation over the anteriolateral aspect, without bony abnormality or deformity. Pt exhibits no edema.  ROM: 4/5 limited by pain  Neurological: Pt  is alert. Coordination normal.  Sensation 5/5 Strength 4/5 limited by pain  Skin: Skin is warm and dry. Pt is not diaphoretic.  No tenting of the skin  Psychiatric: Pt has a normal mood and affect.  Nursing note and vitals reviewed.   ED Treatments / Results  Labs (all labs ordered are listed, but only abnormal results are displayed) Labs Reviewed - No data to display  EKG  EKG Interpretation None       Radiology Dg Knee Complete 4 Views Left  Result Date: 08/08/2016 CLINICAL DATA:  Left knee pain and swelling. Stepped in a hole on Sunday. Initial encounter. EXAM: LEFT KNEE - COMPLETE 4+ VIEW  COMPARISON:  None. FINDINGS: Possible small knee joint effusion. No acute fracture malalignment. No degenerative changes. IMPRESSION: No acute osseous finding.  Possible small joint effusion. Electronically Signed   By: Marnee SpringJonathon  Watts M.D.   On: 08/08/2016 09:03    Procedures Procedures (including critical care time)  Medications Ordered in ED Medications - No data to display   Initial Impression / Assessment and Plan / ED Course  I have reviewed the triage vital signs and the nursing notes.  Pertinent labs & imaging results that were available during my care of the patient were reviewed by me and considered in my medical decision making (see chart for details).  Clinical Course     Patient X-Ray negative for obvious fracture or dislocation.  Pt advised to follow up with orthopedics. Patient given knee sleeve and crutches while in ED, conservative therapy recommended and discussed. Patient will be discharged home & is agreeable with above plan. Returns precautions discussed. Pt appears safe for discharge.   Final Clinical Impressions(s) / ED Diagnoses   Final diagnoses:  Acute pain of left knee  Effusion of left knee    New Prescriptions New Prescriptions   HYDROCODONE-ACETAMINOPHEN (NORCO/VICODIN) 5-325 MG TABLET    Take 1-2 tablets by mouth every 6 (six) hours as needed.   IBUPROFEN (ADVIL,MOTRIN) 800 MG TABLET    Take 1 tablet (800 mg total) by mouth 3 (three) times daily.     Roxy Horsemanobert Aanshi Batchelder, [name] 08/08/16 [id number]    Azalia BilisKevin Campos, MD 08/09/16 [phone]

## 2016-08-08 NOTE — ED Triage Notes (Signed)
Patient reports he stepped in a hole while blowing leaves on Sunday. Patient used ice and ibuprofen, however, noticed increased swelling to left knee starting yesterday morning.

## 2016-10-28 ENCOUNTER — Encounter (HOSPITAL_COMMUNITY): Payer: Self-pay | Admitting: Emergency Medicine

## 2016-10-28 ENCOUNTER — Emergency Department (HOSPITAL_COMMUNITY)
Admission: EM | Admit: 2016-10-28 | Discharge: 2016-10-28 | Disposition: A | Discharge status: Home / Self Care | Payer: Self-pay | Attending: Emergency Medicine | Admitting: Emergency Medicine

## 2016-10-28 DIAGNOSIS — F17290 Nicotine dependence, other tobacco product, uncomplicated: Secondary | ICD-10-CM | POA: Insufficient documentation

## 2016-10-28 DIAGNOSIS — Z79899 Other long term (current) drug therapy: Secondary | ICD-10-CM | POA: Insufficient documentation

## 2016-10-28 DIAGNOSIS — M25562 Pain in left knee: Secondary | ICD-10-CM | POA: Insufficient documentation

## 2016-10-28 MED ORDER — DICLOFENAC SODIUM 75 MG PO TBEC: 75.0000 mg | DELAYED_RELEASE_TABLET | Freq: Two times a day (BID) | ORAL | 0 refills | Status: DC

## 2016-10-28 NOTE — ED Triage Notes (Signed)
Pt reports L knee pain and swelling since meniscus tear back in December. Swelling worse since Wednesday. No redness noted. No new injury. Pt ambulatory.

## 2016-10-28 NOTE — ED Provider Notes (Signed)
[facility]-EMERGENCY DEPT Provider Note   CSN: [id number] Arrival date & time: 10/28/16  1002   By signing my name below, I, Nelwyn Salisbury, attest that this documentation has been prepared under the direction and in the presence of non-physician practitioner, Ok Edwards, [name] . Electronically Signed: Nelwyn Salisbury, Scribe. 10/28/2016. 10:19 AM.  History   Chief Complaint Chief Complaint  Patient presents with  . Knee Pain   The history is provided by the patient. No language interpreter was used.    HPI Comments:  Donald Rhodes is a 41 y.o. male with no pertinent pmhx who presents to the Emergency Department complaining of constant, mild-moderate, worsening left knee swelling which began 3 months ago but exacerbated 3 days ago. Pt states he was seen previously for the same symptoms and was told that he injured his meniscus. Pt reports associated pain to the area. He has tried a knee brace with minimal relief. Pt denies any weakness or numbness.    Past Medical History:  Diagnosis Date  . Irregular heart beat     There are no active problems to display for this patient.   History reviewed. No pertinent surgical history.     Home Medications    Prior to Admission medications   Medication Sig Start Date End Date Taking? Authorizing Provider  benzonatate (TESSALON) 100 MG capsule Take 1 capsule (100 mg total) by mouth every 8 (eight) hours. Patient not taking: Reported on 08/08/2016 11/15/15   Mady Gemma, [name]  chlorpheniramine-HYDROcodone [facility]) 10-8 MG/5ML SUER Take 5 mLs by mouth at bedtime as needed for cough. Patient not taking: Reported on 08/08/2016 11/15/15   Mady Gemma, [name]  HYDROcodone-acetaminophen (NORCO/VICODIN) 5-325 MG tablet Take 1-2 tablets by mouth every 6 (six) hours as needed. 08/08/16   Roxy Horseman, [name]  ibuprofen (ADVIL,MOTRIN) 800 MG tablet Take 1 tablet (800 mg total) by mouth 3 (three) times daily.  08/08/16   Roxy Horseman, [name]  naproxen (NAPROSYN) 500 MG tablet Take 1 tablet (500 mg total) by mouth 2 (two) times daily with a meal. Patient not taking: Reported on 08/08/2016 08/05/14   Harle Battiest, NP  predniSONE (STERAPRED UNI-PAK) 10 MG tablet Take by mouth daily. Day 1: take 6 tabs.  Day 2: 5 tabs  Day 3: 4 tabs  Day 4: 3 tabs  Day 5: 2 tabs  Day 6: 1 tab Patient not taking: Reported on 08/08/2016 07/29/14   Trixie Dredge, [name]    Family History History reviewed. No pertinent family history.  Social History Social History  Substance Use Topics  . Smoking status: Current Every Day Smoker    Packs/day: 0.50    Types: Cigars  . Smokeless tobacco: Never Used  . Alcohol use No     Allergies   Patient has no known allergies.   Review of Systems Review of Systems  Musculoskeletal: Positive for arthralgias and joint swelling.  Neurological: Negative for weakness and numbness.     Physical Exam Updated Vital Signs BP 125/77   Pulse 73   Temp 98.3 F (36.8 C) (Oral)   Resp 16   SpO2 98%   Physical Exam  Constitutional: He is oriented to person, place, and time. He appears well-developed and well-nourished. No distress.  HENT:  Head: Normocephalic and atraumatic.  Eyes: Conjunctivae are normal.  Cardiovascular: Normal rate.   Pulmonary/Chest: Effort normal.  Abdominal: He exhibits no distension.  Musculoskeletal:  Moderate effusion, decreased ROM to left knee.   Neurological:  He is alert and oriented to person, place, and time.  Skin: Skin is warm and dry.  Psychiatric: He has a normal mood and affect.  Nursing note and vitals reviewed.    ED Treatments / Results  DIAGNOSTIC STUDIES:  Oxygen Saturation is 98% on RA, normal by my interpretation.    COORDINATION OF CARE:  10:37 AM Discussed treatment plan with pt at bedside which includes referral to orthopedist and pt agreed to plan.  Labs (all labs ordered are listed, but only abnormal results  are displayed) Labs Reviewed - No data to display  EKG  EKG Interpretation None       Radiology No results found.  Procedures Procedures (including critical care time)  Medications Ordered in ED Medications - No data to display   Initial Impression / Assessment and Plan / ED Course  I have reviewed the triage vital signs and the nursing notes.  Pertinent labs & imaging results that were available during my care of the patient were reviewed by me and considered in my medical decision making (see chart for details).      Pt advised to follow up with orthopedics. Patient given brace while in ED, conservative therapy recommended and discussed. Patient will be discharged home & is agreeable with above plan. Returns precautions discussed. Pt appears safe for discharge.  Final Clinical Impressions(s) / ED Diagnoses   Final diagnoses:  Acute pain of left knee    New Prescriptions New Prescriptions   No medications on file  Scheduled Meds: Continuous Infusions: PRN Meds:. Meds ordered this encounter  Medications  . diclofenac (VOLTAREN) 75 MG EC tablet    Sig: Take 1 tablet (75 mg total) by mouth 2 (two) times daily.    Dispense:  20 tablet    Refill:  0    Order Specific Question:   Supervising Provider    Answer:   Eber HongMILLER, BRIAN [[id number]]  An After Visit Summary was printed and given to the patient.  I personally performed the services in this documentation, which was scribed in my presence.  The recorded information has been reviewed and considered.   Barnet PallKaren SofiaPAC.     Lonia SkinnerLeslie K Moody AFBSofia, [name] 10/28/16 1145    Cathren LaineKevin Steinl, MD 10/31/16 1455

## 2016-10-28 NOTE — Discharge Instructions (Signed)
Return if any problems.

## 2016-10-28 NOTE — ED Notes (Signed)
Bed: WTR8 Expected date:  Expected time:  Means of arrival:  Comments: 

## 2021-02-25 ENCOUNTER — Other Ambulatory Visit: Payer: Self-pay

## 2021-02-25 ENCOUNTER — Encounter (HOSPITAL_COMMUNITY): Payer: Self-pay | Admitting: Emergency Medicine

## 2021-02-25 ENCOUNTER — Emergency Department (HOSPITAL_COMMUNITY)
Admission: EM | Admit: 2021-02-25 | Discharge: 2021-02-25 | Disposition: A | Discharge status: Home / Self Care | Payer: Self-pay | Attending: Emergency Medicine | Admitting: Emergency Medicine

## 2021-02-25 DIAGNOSIS — L738 Other specified follicular disorders: Secondary | ICD-10-CM

## 2021-02-25 DIAGNOSIS — F17210 Nicotine dependence, cigarettes, uncomplicated: Secondary | ICD-10-CM | POA: Insufficient documentation

## 2021-02-25 DIAGNOSIS — L731 Pseudofolliculitis barbae: Secondary | ICD-10-CM | POA: Insufficient documentation

## 2021-02-25 HISTORY — DX: Rheumatoid arthritis, unspecified: M06.9

## 2021-02-25 MED ORDER — DOXYCYCLINE HYCLATE 100 MG PO CAPS: 100.0000 mg | ORAL_CAPSULE | Freq: Two times a day (BID) | ORAL | 0 refills | Status: AC

## 2021-02-25 NOTE — ED Triage Notes (Signed)
C/o rash to R side of face x 4 weeks.  Denies fever and chills.

## 2021-02-25 NOTE — Discharge Instructions (Addendum)
I am prescribing you an antibiotic called doxycycline.  I want to take this twice a day for the next 7 days.  Please discontinue shaving your face during this time.  Please clean your shaving razors extensively with alcohol in hot water.  Continue to monitor your symptoms closely.  If they worsen, please come back to the emergency department.  It was a pleasure to meet you.

## 2021-02-25 NOTE — ED Provider Notes (Signed)
[facility] EMERGENCY DEPARTMENT Provider Note   CSN: [id number] Arrival date & time: 02/25/21  1313     History No chief complaint on file.   Donald Rhodes is a 45 y.o. male.  HPI Patient is a 45 year old male with a medical history as noted below.  He presents to the emergency department with a rash to the right cheek for the past month.  No fevers or chills.  States that he has intermittently had small pustules throughout the rash.  His rash started after using a new shaving razor in the region.  No other complaints.    Past Medical History:  Diagnosis Date   Irregular heart beat    Rheumatoid arthritis (HCC)     There are no problems to display for this patient.   History reviewed. No pertinent surgical history.     No family history on file.  Social History   Tobacco Use   Smoking status: Every Day    Packs/day: 0.50    Pack years: 0.00    Types: Cigars, Cigarettes   Smokeless tobacco: Never  Substance Use Topics   Alcohol use: No   Drug use: No    Home Medications Prior to Admission medications   Medication Sig Start Date End Date Taking? Authorizing Provider  benzonatate (TESSALON) 100 MG capsule Take 1 capsule (100 mg total) by mouth every 8 (eight) hours. Patient not taking: Reported on 08/08/2016 11/15/15   Mady Gemma, [name]  chlorpheniramine-HYDROcodone [facility] ER) 10-8 MG/5ML SUER Take 5 mLs by mouth at bedtime as needed for cough. Patient not taking: Reported on 08/08/2016 11/15/15   Mady Gemma, [name]  diclofenac (VOLTAREN) 75 MG EC tablet Take 1 tablet (75 mg total) by mouth 2 (two) times daily. 10/28/16   Elson Areas, [name]  doxycycline (VIBRAMYCIN) 100 MG capsule Take 1 capsule (100 mg total) by mouth 2 (two) times daily for 7 days. 02/25/21 03/04/21 Yes Placido Sou, [name]  HYDROcodone-acetaminophen (NORCO/VICODIN) 5-325 MG tablet Take 1-2 tablets by mouth every 6 (six) hours as needed.  08/08/16   Roxy Horseman, [name]  ibuprofen (ADVIL,MOTRIN) 800 MG tablet Take 1 tablet (800 mg total) by mouth 3 (three) times daily. 08/08/16   Roxy Horseman, [name]  naproxen (NAPROSYN) 500 MG tablet Take 1 tablet (500 mg total) by mouth 2 (two) times daily with a meal. Patient not taking: Reported on 08/08/2016 08/05/14   Harle Battiest, NP  predniSONE (STERAPRED UNI-PAK) 10 MG tablet Take by mouth daily. Day 1: take 6 tabs.  Day 2: 5 tabs  Day 3: 4 tabs  Day 4: 3 tabs  Day 5: 2 tabs  Day 6: 1 tab Patient not taking: Reported on 08/08/2016 07/29/14   Trixie Dredge, [name]    Allergies    Patient has no known allergies.  Review of Systems   Review of Systems  Constitutional:  Negative for chills and fever.  Skin:  Positive for color change and rash.   Physical Exam Updated Vital Signs BP 116/85   Pulse 94   Temp 97.8 F (36.6 C) (Oral)   Resp 17   SpO2 96%   Physical Exam Vitals and nursing note reviewed.  Constitutional:      General: He is not in acute distress.    Appearance: He is well-developed.  HENT:     Head: Normocephalic and atraumatic.     Right Ear: External ear normal.     Left Ear: External ear normal.  Eyes:     General: No scleral icterus.       Right eye: No discharge.        Left eye: No discharge.     Conjunctiva/sclera: Conjunctivae normal.  Neck:     Trachea: No tracheal deviation.  Cardiovascular:     Rate and Rhythm: Normal rate.  Pulmonary:     Effort: Pulmonary effort is normal. No respiratory distress.     Breath sounds: No stridor.  Abdominal:     General: There is no distension.  Musculoskeletal:        General: No swelling or deformity.     Cervical back: Neck supple.  Skin:    General: Skin is warm and dry.     Findings: Erythema and rash present.     Comments: Papular erythematous rash noted to the right cheek and jaw.  Multiple pustules noted in the region.  Appears consistent with folliculitis.  Neurological:     Mental  Status: He is alert.     Cranial Nerves: Cranial nerve deficit: no gross deficits.    ED Results / Procedures / Treatments   Labs (all labs ordered are listed, but only abnormal results are displayed) Labs Reviewed - No data to display  EKG None  Radiology No results found.  Procedures Procedures   Medications Ordered in ED Medications - No data to display  ED Course  I have reviewed the triage vital signs and the nursing notes.  Pertinent labs & imaging results that were available during my care of the patient were reviewed by me and considered in my medical decision making (see chart for details).    MDM Rules/Calculators/A&P                          Patient is a 45 year old male who presents to the emergency department with what appears to be a developing folliculitis barbae to the right cheek.  Rash follows his beard line.  States that it started after using a new trimmer on his face 1 month ago.  Physical exam otherwise reassuring.  Will discharge on a course of doxycycline.  Recommended patient clean all of his shaving razors with alcohol and hot water and dispose of any previously used shaving razors.  Recommended he discontinue shaving for the next 1 to 2 weeks until his symptoms begin improving.  Discussed return precautions.  Feel he is stable for discharge at this time and he is agreeable.  His questions were answered and he was amicable at the time of discharge.  Final Clinical Impression(s) / ED Diagnoses Final diagnoses:  Folliculitis barbae   Rx / DC Orders ED Discharge Orders          Ordered    doxycycline (VIBRAMYCIN) 100 MG capsule  2 times daily        02/25/21 1403             Placido Sou, [name] 02/25/21 1408    Vanetta Mulders, MD 02/26/21 [phone]

## 2021-04-17 ENCOUNTER — Emergency Department (HOSPITAL_COMMUNITY)
Admission: EM | Admit: 2021-04-17 | Discharge: 2021-04-17 | Disposition: A | Discharge status: Home / Self Care | Payer: Self-pay | Attending: Emergency Medicine | Admitting: Emergency Medicine

## 2021-04-17 ENCOUNTER — Encounter (HOSPITAL_COMMUNITY): Payer: Self-pay

## 2021-04-17 ENCOUNTER — Emergency Department (HOSPITAL_COMMUNITY): Payer: Self-pay

## 2021-04-17 ENCOUNTER — Other Ambulatory Visit: Payer: Self-pay

## 2021-04-17 DIAGNOSIS — S60460A Insect bite (nonvenomous) of right index finger, initial encounter: Secondary | ICD-10-CM | POA: Insufficient documentation

## 2021-04-17 DIAGNOSIS — F17210 Nicotine dependence, cigarettes, uncomplicated: Secondary | ICD-10-CM | POA: Insufficient documentation

## 2021-04-17 DIAGNOSIS — W57XXXA Bitten or stung by nonvenomous insect and other nonvenomous arthropods, initial encounter: Secondary | ICD-10-CM | POA: Insufficient documentation

## 2021-04-17 NOTE — ED Provider Notes (Addendum)
[facility] Note   CSN: [id number] Arrival date & time: 04/17/21  1018     History Chief Complaint  Patient presents with   Insect Bite    "Spider bite right index finger"    Donald Rhodes is a 45 y.o. male.  45 y.o male with a PMH of "irregular heartbeat "presents to the ED with a chief complaint of spider bite for the last 4 days.  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  He did not physically see a spider bit him, however color has changed in nature, there is some darkness to it and worsening pain with movement.  He denies any systemic signs of fever, abdominal pain, shortness of breath, or other complaints.   The history is provided by the patient and medical records.      Past Medical History:  Diagnosis Date   Irregular heart beat    Rheumatoid arthritis (HCC)     There are no problems to display for this patient.   History reviewed. No pertinent surgical history.     History reviewed. No pertinent family history.  Social History   Tobacco Use   Smoking status: Every Day    Packs/day: 0.50    Types: Cigars, Cigarettes   Smokeless tobacco: Never  Substance Use Topics   Alcohol use: No   Drug use: No    Home Medications Prior to Admission medications   Medication Sig Start Date End Date Taking? Authorizing Provider  benzonatate (TESSALON) 100 MG capsule Take 1 capsule (100 mg total) by mouth every 8 (eight) hours. Patient not taking: Reported on 08/08/2016 11/15/15   Mady Gemma, [name]  chlorpheniramine-HYDROcodone [facility]) 10-8 MG/5ML SUER Take 5 mLs by mouth at bedtime as needed for cough. Patient not taking: Reported on 08/08/2016 11/15/15   Mady Gemma, [name]  diclofenac (VOLTAREN) 75 MG EC tablet Take 1 tablet (75 mg total) by mouth 2 (two)  times daily. 10/28/16   Elson Areas, [name]  HYDROcodone-acetaminophen (NORCO/VICODIN) 5-325 MG tablet Take 1-2 tablets by mouth every 6 (six) hours as needed. 08/08/16   Roxy Horseman, [name]  ibuprofen (ADVIL,MOTRIN) 800 MG tablet Take 1 tablet (800 mg total) by mouth 3 (three) times daily. 08/08/16   Roxy Horseman, [name]  naproxen (NAPROSYN) 500 MG tablet Take 1 tablet (500 mg total) by mouth 2 (two) times daily with a meal. Patient not taking: Reported on 08/08/2016 08/05/14   Harle Battiest, NP  predniSONE (STERAPRED UNI-PAK) 10 MG tablet Take by mouth daily. Day 1: take 6 tabs.  Day 2: 5 tabs  Day 3: 4 tabs  Day 4: 3 tabs  Day 5: 2 tabs  Day 6: 1 tab Patient not taking: Reported on 08/08/2016 07/29/14   Trixie Dredge, [name]    Allergies    Patient has no known allergies.  Review of Systems   Review of Systems  Constitutional:  Negative for chills and fever.  Skin:  Positive for wound. Negative for rash.   Physical Exam Updated Vital Signs BP 124/82 (BP Location: Left Arm)   Pulse 69   Temp 98.7 F (37.1 C) (Oral)   Resp 16   Ht 5\' 6"  (1.676 m)   Wt 54.4 kg   SpO2 98%   BMI 19.37 kg/m   Physical Exam Vitals and nursing note  reviewed.  Constitutional:      Appearance: Normal appearance.  HENT:     Head: Normocephalic and atraumatic.     Mouth/Throat:     Mouth: Mucous membranes are moist.  Eyes:     Pupils: Pupils are equal, round, and reactive to light.  Cardiovascular:     Rate and Rhythm: Normal rate.  Pulmonary:     Effort: Pulmonary effort is normal.  Abdominal:     General: Abdomen is flat.  Musculoskeletal:     Cervical back: Normal range of motion and neck supple.  Skin:    General: Skin is warm.     Findings: No bruising.     Comments: Please see pictures attached   Neurological:     Mental Status: He is alert and oriented to person, place, and time.       ED Results / Procedures / Treatments   Labs (all labs ordered are listed, but only  abnormal results are displayed) Labs Reviewed - No data to display  EKG None  Radiology DG Hand Complete Right  Result Date: 04/17/2021 CLINICAL DATA:  Swelling, possible spider bite on Friday along lateral side of distal second finger EXAM: RIGHT HAND - COMPLETE 3+ VIEW COMPARISON:  None. FINDINGS: There is no acute fracture or dislocation. Bony alignment is normal. There is marked radiocarpal joint space narrowing. There is moderate thumb and index finger CMC joint space narrowing. There is mild soft tissue swelling along the proximal index finger. There is no radiopaque foreign body. IMPRESSION: 1. Mild soft tissue swelling along the proximal index finger with no underlying osseous abnormality. No radiopaque foreign body. 2. Marked radiocarpal joint space narrowing and moderate thumb and index finger CMC joint space narrowing likely reflecting sequelae of rheumatoid arthritis. Electronically Signed   By: Lesia Hausen M.D.   On: 04/17/2021 12:43    Procedures Procedures   Medications Ordered in ED Medications - No data to display  ED Course  I have reviewed the triage vital signs and the nursing notes.  Pertinent labs & imaging results that were available during my care of the patient were reviewed by me and considered in my medical decision making (see chart for details).    MDM Rules/Calculators/A&P   Patient with no past medical history presents to the ED with a chief complaint of insect bite to the right index finger which happened 4 days ago.  Does report increase in pain, lower with color changes, he is unsure whether he got bit by a spider.  Derm evaluation there is pain with palpation of the wound, it measures about 3 cm length, 1 cm with.  No active drainage, no surrounding erythema.  He is afebrile have a lower suspicion for infection at this time.  X-ray without any signs of osteomyelitis at this time.  He does have some pain with ranging of the joint, however is able to range  it at this time.  Does have a longstanding history of smoking, however have a lower suspicion for vasculitis as there is no other wounds noted to his fingers or toes.  We we will provide him with Tylenol here, continue pain control at home.    Patient informed of his x-ray results, we discussed supportive treatment.  Patient began using profanity at this time, I discussed with him that no emergent work-up needs to be obtained at this time.  Patient stable for discharge.  Portions of this note were generated with Scientist, clinical (histocompatibility and immunogenetics). Dictation errors may  occur despite best attempts at proofreading.  Final Clinical Impression(s) / ED Diagnoses Final diagnoses:  Insect bite of right index finger, initial encounter    Rx / DC Orders ED Discharge Orders     None        Claude Manges, [name] 04/17/21 1300    Claude Manges, [name] 04/17/21 1319    Linwood Dibbles, MD 04/18/21 1444

## 2021-04-17 NOTE — ED Triage Notes (Signed)
Pt presents to the ED via POV for a "spider bite" to his right index finger which he states he "thinks happened" four days ago. The tip of the right index finger is blackened in appearance and "painful" per the pt. Pt denies any known health hx.

## 2021-04-17 NOTE — Discharge Instructions (Addendum)
Your xray today did not show any bone involvement. You may continue treating the pain with iburpofen or tylenol for improvement in your symptoms.  If the same blistering pattern appears anywhere in your other fingers or toes you will need to return to the ED.

## 2021-04-27 ENCOUNTER — Emergency Department (HOSPITAL_COMMUNITY)
Admission: EM | Admit: 2021-04-27 | Discharge: 2021-04-27 | Disposition: A | Discharge status: Home / Self Care | Payer: Self-pay | Attending: Emergency Medicine | Admitting: Emergency Medicine

## 2021-04-27 ENCOUNTER — Emergency Department (HOSPITAL_COMMUNITY): Payer: Self-pay

## 2021-04-27 ENCOUNTER — Other Ambulatory Visit: Payer: Self-pay

## 2021-04-27 DIAGNOSIS — L03019 Cellulitis of unspecified finger: Secondary | ICD-10-CM

## 2021-04-27 DIAGNOSIS — F17210 Nicotine dependence, cigarettes, uncomplicated: Secondary | ICD-10-CM | POA: Insufficient documentation

## 2021-04-27 DIAGNOSIS — L03011 Cellulitis of right finger: Secondary | ICD-10-CM | POA: Insufficient documentation

## 2021-04-27 LAB — CBC WITH DIFFERENTIAL/PLATELET
Abs Immature Granulocytes: 0.04 10*3/uL (ref 0.00–0.07)
Basophils Absolute: 0.1 10*3/uL (ref 0.0–0.1)
Basophils Relative: 1 %
Eosinophils Absolute: 0.3 10*3/uL (ref 0.0–0.5)
Eosinophils Relative: 3 %
HCT: 42.1 % (ref 39.0–52.0)
Hemoglobin: 13.4 g/dL (ref 13.0–17.0)
Immature Granulocytes: 1 %
Lymphocytes Relative: 29 %
Lymphs Abs: 2.4 10*3/uL (ref 0.7–4.0)
MCH: 27.8 pg (ref 26.0–34.0)
MCHC: 31.8 g/dL (ref 30.0–36.0)
MCV: 87.3 fL (ref 80.0–100.0)
Monocytes Absolute: 0.8 10*3/uL (ref 0.1–1.0)
Monocytes Relative: 10 %
Neutro Abs: 4.7 10*3/uL (ref 1.7–7.7)
Neutrophils Relative %: 56 %
Platelets: 277 10*3/uL (ref 150–400)
RBC: 4.82 MIL/uL (ref 4.22–5.81)
RDW: 15.1 % (ref 11.5–15.5)
WBC: 8.4 10*3/uL (ref 4.0–10.5)
nRBC: 0 % (ref 0.0–0.2)

## 2021-04-27 LAB — BASIC METABOLIC PANEL
Anion gap: 6 (ref 5–15)
BUN: 10 mg/dL (ref 6–20)
CO2: 27 mmol/L (ref 22–32)
Calcium: 8.9 mg/dL (ref 8.9–10.3)
Chloride: 106 mmol/L (ref 98–111)
Creatinine, Ser: 0.67 mg/dL (ref 0.61–1.24)
GFR, Estimated: >60 mL/min (ref >60)
Glucose, Bld: 93 mg/dL (ref 70–99)
Potassium: 4.2 mmol/L (ref 3.5–5.1)
Sodium: 139 mmol/L (ref 135–145)

## 2021-04-27 MED ORDER — SULFAMETHOXAZOLE-TRIMETHOPRIM 800-160 MG PO TABS
1.0000 | ORAL_TABLET | Freq: Once | ORAL | Status: AC
  Administered 2021-04-27: 1 via ORAL
  Filled 2021-04-27: qty 1

## 2021-04-27 MED ORDER — LIDOCAINE HCL (PF) 1 % IJ SOLN
5.0000 mL | Freq: Once | INTRAMUSCULAR | Status: AC
  Administered 2021-04-27: 5 mL via INTRADERMAL
  Filled 2021-04-27: qty 5

## 2021-04-27 MED ORDER — SULFAMETHOXAZOLE-TRIMETHOPRIM 800-160 MG PO TABS: 1.0000 | ORAL_TABLET | Freq: Two times a day (BID) | ORAL | 0 refills | Status: AC

## 2021-04-27 NOTE — ED Provider Notes (Signed)
Emergency Medicine Provider Triage Evaluation Note  Donald Rhodes , a 45 y.o. male  was evaluated in triage.  Pt complains of wound on right finger. Been present for 2 weeks, seen at [facility] one week ago. Given tylenol. No fevers, no chills. Painful and numb feeling with some drainage. Worsened from one week ago per patient.   Review of Systems  Positive: Wound Negative: Fevers  Physical Exam  BP 126/72 (BP Location: Left Arm)   Pulse 72   Temp 98.4 F (36.9 C) (Oral)   Resp 14   SpO2 100%  Gen:   Awake, no distress   Resp:  Normal effort  MSK:   Moves extremities without difficulty  Other:  Radial pulse 2+, finger with area of apparent necrosis. Tender to touch, no active drainage  Medical Decision Making  Medically screening exam initiated at 9:04 AM.  Appropriate orders placed.  Drusilla Kanner was informed that the remainder of the evaluation will be completed by another provider, this initial triage assessment does not replace that evaluation, and the importance of remaining in the ED until their evaluation is complete.  Will repeat xray and get basic labs to check white count and creatine in case additional workup needed. Doesn't appear celulitic, but very tender. Concern for osteomyelitis since last visit or septic joint. Has cap refill but pallor to third and 4th finer as well. May need vascular workup. Defer for additional workup in back.     Theron Arista, [name] 04/27/21 3086    Tegeler, Canary Brim, MD 04/27/21 [phone]

## 2021-04-27 NOTE — ED Triage Notes (Signed)
C/O wound on fingers that is getting worst x 3 weeks,

## 2021-04-27 NOTE — Discharge Instructions (Addendum)
As discussed, it is important that you monitor your condition carefully and do not hesitate to return here if you develop new, or concerning changes.  Otherwise take your antibiotics as prescribed, use Tylenol, ibuprofen for pain control, and follow-up at urgent care or here or with your physician in 3 days for wound check.

## 2021-04-27 NOTE — ED Provider Notes (Signed)
[facility] EMERGENCY DEPARTMENT Provider Note   CSN: [id number] Arrival date & time: 04/27/21  3335     History Chief Complaint  Patient presents with   Wound Check    Donald Rhodes is a 45 y.o. male.  HPI Patient presents 1 week after being seen initially following an injury to his right index finger.  He notes that since that evaluation he has had increased size, discomfort and discoloration of the lateral aspect of the distal index finger.  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  Pain is worse with motion, palpation, no fever, chills, nausea, He does have much less severe, though similar discoloration on the left distal index finger same area.    Past Medical History:  Diagnosis Date   Irregular heart beat    Rheumatoid arthritis (HCC)     There are no problems to display for this patient.   No past surgical history on file.     No family history on file.  Social History   Tobacco Use   Smoking status: Every Day    Packs/day: 0.50    Types: Cigars, Cigarettes   Smokeless tobacco: Never  Substance Use Topics   Alcohol use: No   Drug use: No    Home Medications Prior to Admission medications   Medication Sig Start Date End Date Taking? Authorizing Provider  benzonatate (TESSALON) 100 MG capsule Take 1 capsule (100 mg total) by mouth every 8 (eight) hours. Patient not taking: Reported on 08/08/2016 11/15/15   Mady Gemma, [name]  chlorpheniramine-HYDROcodone [facility] ER) 10-8 MG/5ML SUER Take 5 mLs by mouth at bedtime as needed for cough. Patient not taking: Reported on 08/08/2016 11/15/15   Mady Gemma, [name]  diclofenac (VOLTAREN) 75 MG EC tablet Take 1 tablet (75 mg total) by mouth 2 (two) times daily. 10/28/16   Elson Areas, [name]  HYDROcodone-acetaminophen (NORCO/VICODIN) 5-325 MG tablet Take 1-2 tablets by mouth every 6 (six) hours as needed. 08/08/16   Roxy Horseman, [name]   ibuprofen (ADVIL,MOTRIN) 800 MG tablet Take 1 tablet (800 mg total) by mouth 3 (three) times daily. 08/08/16   Roxy Horseman, [name]  naproxen (NAPROSYN) 500 MG tablet Take 1 tablet (500 mg total) by mouth 2 (two) times daily with a meal. Patient not taking: Reported on 08/08/2016 08/05/14   Harle Battiest, NP  predniSONE (STERAPRED UNI-PAK) 10 MG tablet Take by mouth daily. Day 1: take 6 tabs.  Day 2: 5 tabs  Day 3: 4 tabs  Day 4: 3 tabs  Day 5: 2 tabs  Day 6: 1 tab Patient not taking: Reported on 08/08/2016 07/29/14   Trixie Dredge, [name]    Allergies    Patient has no known allergies.  Review of Systems   Review of Systems  Constitutional:        Per HPI, otherwise negative  HENT:         Per HPI, otherwise negative  Respiratory:         Per HPI, otherwise negative  Cardiovascular:        Per HPI, otherwise negative  Gastrointestinal:  Negative for vomiting.  Endocrine:       Negative aside from HPI  Genitourinary:        Neg aside from HPI   Musculoskeletal:        Per HPI, otherwise negative  Skin:  Positive for wound.  Neurological:  Negative for syncope.   Physical Exam Updated  Vital Signs BP 112/77   Pulse 64   Temp 98.4 F (36.9 C) (Oral)   Resp 14   Ht 5\' 6"  (1.676 m)   Wt 54.4 kg   SpO2 100%   BMI 19.37 kg/m   Physical Exam Vitals and nursing note reviewed.  Constitutional:      General: He is not in acute distress.    Appearance: He is well-developed.  HENT:     Head: Normocephalic and atraumatic.  Eyes:     Conjunctiva/sclera: Conjunctivae normal.  Cardiovascular:     Rate and Rhythm: Normal rate and regular rhythm.  Pulmonary:     Effort: Pulmonary effort is normal. No respiratory distress.     Breath sounds: No stridor.  Abdominal:     General: There is no distension.  Musculoskeletal:     Comments: Range of motion of both hands, all fingers unremarkable.  Left distal index finger with small area of erythema, no purulence, drainage,  sclerosis. Right index finger with sclerotic, discolored area and anterior purulent collection.  Skin:    General: Skin is warm and dry.  Neurological:     Mental Status: He is alert and oriented to person, place, and time.    ED Results / Procedures / Treatments   Labs (all labs ordered are listed, but only abnormal results are displayed) Labs Reviewed  BASIC METABOLIC PANEL  CBC WITH DIFFERENTIAL/PLATELET    EKG None  Radiology DG Hand Complete Right  Result Date: 04/27/2021 CLINICAL DATA:  Three-week history of and pain and swelling. EXAM: RIGHT HAND - COMPLETE 3+ VIEW COMPARISON:  04/17/2021 FINDINGS: Stable advanced arthropathic findings involving the wrist most likely the sequela rheumatoid arthritis with SLAC wrist pattern. No significant involvement of the MCP joints. No acute bony findings. IMPRESSION: Stable advanced arthropathic changes involving the wrist most likely the sequela of rheumatoid arthritis with SLAC wrist pattern. No acute bony findings. Electronically Signed   By: 04/19/2021 M.D.   On: 04/27/2021 09:47    Procedures .11/08/2022Incision and Drainage  Date/Time: 04/27/2021 6:40 PM Performed by: 06/27/2021, MD Authorized by: Gerhard Munch, MD   Consent:    Consent obtained:  Verbal   Consent given by:  Patient   Risks, benefits, and alternatives were discussed: yes     Risks discussed:  Bleeding, incomplete drainage, pain and infection   Alternatives discussed:  No treatment, delayed treatment and alternative treatment Universal protocol:    Procedure explained and questions answered to patient or proxy's satisfaction: yes     Relevant documents present and verified: yes     Test results available : yes     Imaging studies available: yes     Required blood products, implants, devices, and special equipment available: yes     Site/side marked: yes     Immediately prior to procedure, a time out was called: yes     Patient identity confirmed:  Verbally  with patient Location:    Type:  Fluid collection   Size:  2   Location:  Upper extremity   Upper extremity location:  Finger   Finger location:  R index finger Pre-procedure details:    Skin preparation:  Chlorhexidine with alcohol Sedation:    Sedation type:  None Anesthesia:    Anesthesia method:  Nerve block   Block location:  R index finger   Block needle gauge:  27 G   Block anesthetic:  Lidocaine 1% w/o epi   Block technique:  Digital ring  Block injection procedure:  Anatomic landmarks identified, introduced needle, incremental injection, negative aspiration for blood and anatomic landmarks palpated   Block outcome:  Anesthesia achieved Procedure type:    Complexity:  Simple Procedure details:    Ultrasound guidance: no     Needle aspiration: no     Incision types:  Single with marsupialization   Incision depth:  Subfascial   Wound management:  Probed and deloculated   Drainage:  Purulent and bloody   Drainage amount:  Moderate   Wound treatment:  Wound left open   Packing materials:  None Post-procedure details:    Procedure completion:  Tolerated well, no immediate complications Comments:     Dry with antibiotic ointment, gauze, bulky dressing in place.   Medications Ordered in ED Medications  lidocaine (PF) (XYLOCAINE) 1 % injection 5 mL (has no administration in time range)  sulfamethoxazole-trimethoprim (BACTRIM DS) 800-160 MG per tablet 1 tablet (has no administration in time range)    ED Course  I have reviewed the triage vital signs and the nursing notes.  Pertinent labs & imaging results that were available during my care of the patient were reviewed by me and considered in my medical decision making (see chart for details).  In comparison to the pictures from last week's visit the patient has developed increased swelling, erythema, and focal area of fluctuance on the anterior aspect of his right index finger.   6:38 PM Patient tolerated incision  and drainage after digital block without complication.  He is able to flex and extend the finger nearly fully, no evidence for tendon involvement, no evidence for osteomyelitis on reviewing the patient's x-ray, labs.  Patient appropriate for ongoing oral antibiotics, wound check in 2 to 4 days.  He and I also explicitly discussed return precautions. MDM Rules/Calculators/A&P Homeless adult male awake, alert, presents with worsening swelling in his right index finger.  Patient has no evidence for osteomyelitis, no evidence of bacteremia, sepsis, no evidence for tendon involvement, does have felon requiring drainage here. Final Clinical Impression(s) / ED Diagnoses Final diagnoses:  Felon of finger    Rx / DC Orders ED Discharge Orders          Ordered    sulfamethoxazole-trimethoprim (BACTRIM DS) 800-160 MG tablet  2 times daily        04/27/21 1834             Gerhard Munch, MD 04/27/21 1843

## 2021-05-01 ENCOUNTER — Ambulatory Visit (HOSPITAL_COMMUNITY)
Admission: EM | Admit: 2021-05-01 | Discharge: 2021-05-01 | Disposition: A | Discharge status: Home / Self Care | Payer: Self-pay | Attending: Internal Medicine | Admitting: Internal Medicine

## 2021-05-01 ENCOUNTER — Other Ambulatory Visit: Payer: Self-pay

## 2021-05-01 ENCOUNTER — Encounter (HOSPITAL_COMMUNITY): Payer: Self-pay

## 2021-05-01 DIAGNOSIS — Z4889 Encounter for other specified surgical aftercare: Secondary | ICD-10-CM

## 2021-05-01 NOTE — ED Triage Notes (Signed)
Pt presents for wound check on rt pointer finger. Pt endorses increased pain since Saturday evening.

## 2021-05-01 NOTE — Discharge Instructions (Signed)
Please continue daily wound dressing changes Complete the course of antibiotics Continue soaking your finger in warm salt water If you notice worsening redness, swelling, discharge please return to urgent care to be reevaluated.

## 2021-05-01 NOTE — ED Provider Notes (Signed)
[facility]    CSN: [id number] Arrival date & time: 05/01/21  1046      History   Chief Complaint Chief Complaint  Patient presents with   Wound Check    HPI Donald Rhodes is a 45 y.o. male comes to the urgent care for wound check.  He was seen in the emergency department on 04/27/2021 for felon of the right index finger.  Incision and drainage was done and patient was advised to follow-up with the urgent care for wound check.  Wound is healing well.  He continues to have some tenderness with mild swelling in the right index finger.  No redness or discharge.  Pain is better.  He is beginning to feel some numbness and tingling to the tip of the fingers.Marland Kitchen   HPI  Past Medical History:  Diagnosis Date   Irregular heart beat    Rheumatoid arthritis (HCC)     There are no problems to display for this patient.   History reviewed. No pertinent surgical history.     Home Medications    Prior to Admission medications   Medication Sig Start Date End Date Taking? Authorizing Provider  diclofenac (VOLTAREN) 75 MG EC tablet Take 1 tablet (75 mg total) by mouth 2 (two) times daily. 10/28/16   Elson Areas, [name]  ibuprofen (ADVIL,MOTRIN) 800 MG tablet Take 1 tablet (800 mg total) by mouth 3 (three) times daily. 08/08/16   Roxy Horseman, [name]  sulfamethoxazole-trimethoprim (BACTRIM DS) 800-160 MG tablet Take 1 tablet by mouth 2 (two) times daily for 7 days. 04/27/21 05/04/21  Gerhard Munch, MD    Family History History reviewed. No pertinent family history.  Social History Social History   Tobacco Use   Smoking status: Every Day    Packs/day: 0.50    Types: Cigars, Cigarettes   Smokeless tobacco: Never  Substance Use Topics   Alcohol use: No   Drug use: No     Allergies   Patient has no known allergies.   Review of Systems Review of Systems  Musculoskeletal: Negative.   Skin:  Positive for color change and wound. Negative for pallor and rash.   Neurological: Negative.     Physical Exam Triage Vital Signs ED Triage Vitals  Enc Vitals Group     BP 05/01/21 1139 109/73     Pulse Rate 05/01/21 1139 67     Resp 05/01/21 1139 14     Temp 05/01/21 1139 97.9 F (36.6 C)     Temp Source 05/01/21 1139 Oral     SpO2 05/01/21 1139 97 %     Weight --      Height --      Head Circumference --      Peak Flow --      Pain Score 05/01/21 1140 4     Pain Loc --      Pain Edu? --      Excl. in GC? --    No data found.  Updated Vital Signs BP 109/73 (BP Location: Left Arm)   Pulse 67   Temp 97.9 F (36.6 C) (Oral)   Resp 14   SpO2 97%   Visual Acuity Right Eye Distance:   Left Eye Distance:   Bilateral Distance:    Right Eye Near:   Left Eye Near:    Bilateral Near:     Physical Exam Vitals and nursing note reviewed.  Constitutional:      General: He is not in acute  distress.    Appearance: He is not ill-appearing.  Cardiovascular:     Rate and Rhythm: Normal rate and regular rhythm.  Musculoskeletal:     Comments: Healing wound on the distal aspect of the right index finger.  No purulent discharge.  No significant erythema.  Healthy scab noted.  Neurological:     Mental Status: He is alert.     UC Treatments / Results  Labs (all labs ordered are listed, but only abnormal results are displayed) Labs Reviewed - No data to display  EKG   Radiology No results found.  Procedures Procedures (including critical care time)  Medications Ordered in UC Medications - No data to display  Initial Impression / Assessment and Plan / UC Course  I have reviewed the triage vital signs and the nursing notes.  Pertinent labs & imaging results that were available during my care of the patient were reviewed by me and considered in my medical decision making (see chart for details).     1.  Postsurgical wound check: Continue daily wound dressing changes Continue soaking in Epson salt water. If you have worsening  pain, swelling, discharge please return to urgent care to be reevaluated. Final Clinical Impressions(s) / UC Diagnoses   Final diagnoses:  Encounter for post surgical wound check     Discharge Instructions      Please continue daily wound dressing changes Complete the course of antibiotics Continue soaking your finger in warm salt water If you notice worsening redness, swelling, discharge please return to urgent care to be reevaluated.   ED Prescriptions   None    PDMP not reviewed this encounter.   Merrilee Jansky, MD 05/01/21 1806

## 2022-02-26 ENCOUNTER — Encounter (HOSPITAL_COMMUNITY): Payer: Self-pay | Admitting: *Deleted

## 2022-02-26 ENCOUNTER — Ambulatory Visit (HOSPITAL_COMMUNITY)
Admission: EM | Admit: 2022-02-26 | Discharge: 2022-02-26 | Disposition: A | Discharge status: Home / Self Care | Payer: Self-pay | Attending: Family Medicine | Admitting: Family Medicine

## 2022-02-26 ENCOUNTER — Other Ambulatory Visit: Payer: Self-pay

## 2022-02-26 ENCOUNTER — Ambulatory Visit (INDEPENDENT_AMBULATORY_CARE_PROVIDER_SITE_OTHER): Payer: Self-pay

## 2022-02-26 DIAGNOSIS — M25561 Pain in right knee: Secondary | ICD-10-CM

## 2022-02-26 MED ORDER — PREDNISONE 20 MG PO TABS: 40.0000 mg | ORAL_TABLET | Freq: Every day | ORAL | 0 refills | Status: AC

## 2022-02-26 MED ORDER — DOXYCYCLINE HYCLATE 100 MG PO CAPS: 100.0000 mg | ORAL_CAPSULE | Freq: Two times a day (BID) | ORAL | 0 refills | Status: AC

## 2022-02-26 NOTE — ED Provider Notes (Signed)
[facility]    CSN: [id number] Arrival date & time: 02/26/22  0849      History   Chief Complaint Chief Complaint  Patient presents with   Knee Pain    HPI Donald Rhodes is a 46 y.o. male.    Knee Pain  Here for right knee pain and swelling that began in the last week.  He relates it to a tick bite he had in his right inguinal area about 4 weeks ago.  No recent fever or chills.  No rash.  No trauma or fall.  He then states that he has had a history of rheumatoid arthritis or Lyme disease diagnosed possibly about 6 years ago  Past Medical History:  Diagnosis Date   Irregular heart beat    Rheumatoid arthritis (HCC)     There are no problems to display for this patient.   History reviewed. No pertinent surgical history.     Home Medications    Prior to Admission medications   Medication Sig Start Date End Date Taking? Authorizing Provider  doxycycline (VIBRAMYCIN) 100 MG capsule Take 1 capsule (100 mg total) by mouth 2 (two) times daily for 7 days. 02/26/22 03/05/22 Yes Eunique Balik, Janace Aris, MD  predniSONE (DELTASONE) 20 MG tablet Take 2 tablets (40 mg total) by mouth daily with breakfast for 5 days. 02/26/22 03/03/22 Yes Chastidy Ranker, Janace Aris, MD    Family History History reviewed. No pertinent family history.  Social History Social History   Tobacco Use   Smoking status: Every Day    Packs/day: 0.50    Types: Cigars, Cigarettes   Smokeless tobacco: Never  Substance Use Topics   Alcohol use: No   Drug use: No     Allergies   Patient has no known allergies.   Review of Systems Review of Systems   Physical Exam Triage Vital Signs ED Triage Vitals  Enc Vitals Group     BP 02/26/22 0931 131/73     Pulse Rate 02/26/22 0931 68     Resp 02/26/22 0931 18     Temp 02/26/22 0931 97.9 F (36.6 C)     Temp src --      SpO2 02/26/22 0931 100 %     Weight --      Height --      Head Circumference --      Peak Flow --      Pain Score  02/26/22 0930 10     Pain Loc --      Pain Edu? --      Excl. in GC? --    No data found.  Updated Vital Signs BP 131/73   Pulse 68   Temp 97.9 F (36.6 C)   Resp 18   SpO2 100%   Visual Acuity Right Eye Distance:   Left Eye Distance:   Bilateral Distance:    Right Eye Near:   Left Eye Near:    Bilateral Near:     Physical Exam   UC Treatments / Results  Labs (all labs ordered are listed, but only abnormal results are displayed) Labs Reviewed - No data to display  EKG   Radiology DG Knee 2 Views Right  Result Date: 02/26/2022 CLINICAL DATA:  Pain and swelling without trauma EXAM: RIGHT KNEE - 1-2 VIEW COMPARISON:  None Available. FINDINGS: No acute fracture or dislocation. Marked medial, moderate lateral, and relatively mild patellofemoral compartment joint space narrowing. Small suprapatellar joint effusion. IMPRESSION: Moderate, markedly age advanced  osteoarthritis with small suprapatellar joint effusion. Electronically Signed   By: Jeronimo Greaves M.D.   On: 02/26/2022 10:38    Procedures Procedures (including critical care time)  Medications Ordered in UC Medications - No data to display  Initial Impression / Assessment and Plan / UC Course  I have reviewed the triage vital signs and the nursing notes.  Pertinent labs & imaging results that were available during my care of the patient were reviewed by me and considered in my medical decision making (see chart for details).     X-ray shows a good bit of degenerative joint disease.  I will treat with doxycycline due to his concern about it being related to the tick bite.  Also 5 days of prednisone.  Assistance is requested to help him find a primary doctor Final Clinical Impressions(s) / UC Diagnoses   Final diagnoses:  Acute pain of right knee     Discharge Instructions      Your x-ray shows lots of arthritis, osteoarthritis.  I do think this is the more likely reason for your knee pain and  swelling.  Take doxycycline 100 mg--1 capsule 2 times daily for 7 days.  This is to treat or prevent Lyme disease  Take prednisone 20 mg--2 daily for 5 days.     ED Prescriptions     Medication Sig Dispense Auth. Provider   doxycycline (VIBRAMYCIN) 100 MG capsule Take 1 capsule (100 mg total) by mouth 2 (two) times daily for 7 days. 14 capsule Zenia Resides, MD   predniSONE (DELTASONE) 20 MG tablet Take 2 tablets (40 mg total) by mouth daily with breakfast for 5 days. 10 tablet Marlinda Mike Janace Aris, MD      I have reviewed the PDMP during this encounter.   Zenia Resides, MD 02/26/22 1048

## 2022-02-26 NOTE — ED Triage Notes (Signed)
PT reports he has Rt knee Pain.

## 2022-02-26 NOTE — Discharge Instructions (Signed)
Your x-ray shows lots of arthritis, osteoarthritis.  I do think this is the more likely reason for your knee pain and swelling.  Take doxycycline 100 mg--1 capsule 2 times daily for 7 days.  This is to treat or prevent Lyme disease  Take prednisone 20 mg--2 daily for 5 days.

## 2022-03-12 ENCOUNTER — Encounter: Payer: Self-pay | Admitting: Physician Assistant

## 2022-03-12 ENCOUNTER — Ambulatory Visit: Payer: Self-pay | Admitting: Physician Assistant

## 2022-03-12 VITALS — BP 116/74 | HR 55 | Resp 18 | Ht 66.0 in | Wt 129.0 lb

## 2022-03-12 DIAGNOSIS — Z8739 Personal history of other diseases of the musculoskeletal system and connective tissue: Secondary | ICD-10-CM

## 2022-03-12 DIAGNOSIS — Z1159 Encounter for screening for other viral diseases: Secondary | ICD-10-CM

## 2022-03-12 DIAGNOSIS — M25561 Pain in right knee: Secondary | ICD-10-CM

## 2022-03-12 DIAGNOSIS — Z13228 Encounter for screening for other metabolic disorders: Secondary | ICD-10-CM

## 2022-03-12 DIAGNOSIS — Z114 Encounter for screening for human immunodeficiency virus [HIV]: Secondary | ICD-10-CM

## 2022-03-12 DIAGNOSIS — S30865A Insect bite (nonvenomous) of unspecified external genital organs, male, initial encounter: Secondary | ICD-10-CM

## 2022-03-12 DIAGNOSIS — W57XXXA Bitten or stung by nonvenomous insect and other nonvenomous arthropods, initial encounter: Secondary | ICD-10-CM

## 2022-03-12 NOTE — Progress Notes (Unsigned)
   New Patient Office Visit  Subjective    Patient ID: Donald Rhodes, male    DOB: 1976-03-16  Age: 46 y.o. MRN: [id number]  CC: No chief complaint on file.   HPI DAMEER SPEISER presents to establish care *** Caused the same as the other knee - no target mark   2017 on the left knee Went back home -  Rght side inguinal Then right knee  Doxycycline  - steroids - no help  Has fallen a couple of times in past 4-5 days due to knee  History of RA  Decline  shot Declines ibuprofen  concave Ok for ortho No outpatient encounter medications on file as of 03/12/2022.   No facility-administered encounter medications on file as of 03/12/2022.    Past Medical History:  Diagnosis Date   Irregular heart beat    Rheumatoid arthritis (HCC)     No past surgical history on file.  No family history on file.  Social History   Socioeconomic History   Marital status: Single    Spouse name: Not on file   Number of children: Not on file   Years of education: Not on file   Highest education level: Not on file  Occupational History   Not on file  Tobacco Use   Smoking status: Every Day    Packs/day: 0.50    Types: Cigars, Cigarettes   Smokeless tobacco: Never  Substance and Sexual Activity   Alcohol use: No   Drug use: No   Sexual activity: Not on file  Other Topics Concern   Not on file  Social History Narrative   Not on file   Social Determinants of Health   Financial Resource Strain: Not on file  Food Insecurity: Not on file  Transportation Needs: Not on file  Physical Activity: Not on file  Stress: Not on file  Social Connections: Not on file  Intimate Partner Violence: Not on file    ROS      Objective    There were no vitals taken for this visit.  Physical Exam  {Labs (Optional):23779}    Assessment & Plan:   Problem List Items Addressed This Visit   None   No follow-ups on file.   Kasandra Knudsen Mayers, [name]

## 2022-03-12 NOTE — Patient Instructions (Addendum)
I encourage you to continue using ibuprofen, wearing the wrap or brace and using ice as needed.  We will call you with the lab results as soon as they are available.  I hope that you feel better soon, please let us know if there is anything else we can do for you.  Roney Jaffe, [name] Physician Assistant [facility] [email or web address]  Acute Knee Pain, Adult Acute knee pain is sudden and may be caused by damage, swelling, or irritation of the muscles and tissues that support the knee. Pain may result from: A fall. An injury to the knee from twisting motions. A hit to the knee. Infection. Acute knee pain may go away on its own with time and rest. If it does not, your health care provider may order tests to find the cause of the pain. These may include: Imaging tests, such as an X-ray, MRI, CT scan, or ultrasound. Joint aspiration. In this test, fluid is removed from the knee and evaluated. Arthroscopy. In this test, a lighted tube is inserted into the knee and an image is projected onto a TV screen. Biopsy. In this test, a sample of tissue is removed from the body and studied under a microscope. Follow these instructions at home: If you have a knee sleeve or brace:  Wear the knee sleeve or brace as told by your health care provider. Remove it only as told by your health care provider. Loosen it if your toes tingle, become numb, or turn cold and blue. Keep it clean. If the knee sleeve or brace is not waterproof: Do not let it get wet. Cover it with a watertight covering when you take a bath or shower. Activity Rest your knee. Do not do things that cause pain or make pain worse. Avoid high-impact activities or exercises, such as running, jumping rope, or doing jumping jacks. Work with a physical therapist to make a safe exercise program, as recommended by your health care provider. Do exercises as told by your physical  therapist. Managing pain, stiffness, and swelling  If directed, put ice on the affected knee. To do this: If you have a removable knee sleeve or brace, remove it as told by your health care provider. Put ice in a plastic bag. Place a towel between your skin and the bag. Leave the ice on for 20 minutes, 2-3 times a day. Remove the ice if your skin turns bright red. This is very important. If you cannot feel pain, heat, or cold, you have a greater risk of damage to the area. If directed, use an elastic bandage to put pressure (compression) on your injured knee. This may control swelling, give support, and help with discomfort. Raise (elevate) your knee above the level of your heart while you are sitting or lying down. Sleep with a pillow under your knee. General instructions Take over-the-counter and prescription medicines only as told by your health care provider. Do not use any products that contain nicotine or tobacco, such as cigarettes, e-cigarettes, and chewing tobacco. If you need help quitting, ask your health care provider. If you are overweight, work with your health care provider and a dietitian to set a weight-loss goal that is healthy and reasonable for you. Extra weight can put pressure on your knee. Pay attention to any changes in your symptoms. Keep all follow-up visits. This is important. Contact a health care provider if: Your knee pain continues, changes, or gets worse. You have a fever along with knee pain.  Your knee feels warm to the touch or is red. Your knee buckles or locks up. Get help right away if: Your knee swells, and the swelling becomes worse. You cannot move your knee. You have severe pain in your knee that cannot be managed with pain medicine. Summary Acute knee pain can be caused by a fall, an injury, an infection, or damage, swelling, or irritation of the tissues that support your knee. Your health care provider may perform tests to find out the cause of  the pain. Pay attention to any changes in your symptoms. Relieve your pain with rest, medicines, light activity, and the use of ice. Get help right away if your knee swells, you cannot move your knee, or you have severe pain that cannot be managed with medicine. This information is not intended to replace advice given to you by your health care provider. Make sure you discuss any questions you have with your health care provider. Document Revised: 01/20/2020 Document Reviewed: 01/20/2020 Elsevier Patient Education  2023 [employer].

## 2022-03-13 ENCOUNTER — Encounter: Payer: Self-pay | Admitting: Physician Assistant

## 2022-03-13 NOTE — Progress Notes (Deleted)
  Subjective:    Donald Rhodes - 46 y.o. male MRN [id number]  Date of birth: [birth year]  HPI  Donald Rhodes is to establish care.   Current issues and/or concerns: Last appt 03/12/2022 with Maurene Capes, PA   1. Acute pain of right knee Patient declines prescription for ibuprofen, declines ketorolac injection in clinic.  Patient education given on supportive care, red flags given for prompt reevaluation.  Patient given appointment to establish care at [facility], patient given application for [facility] health financial assistance.   Right Knee imaging IMPRESSION: Moderate, markedly age advanced osteoarthritis with small suprapatellar joint effusion.     Electronically Signed   By: Jeronimo Greaves M.D.   On: 02/26/2022 10:38       - Ambulatory referral to Orthopedic Surgery   2. Tick bite of male external genital organ, unspecified organ, initial encounter   - Lyme Disease Serology w/Reflex   3. History of rheumatoid arthritis   - ANA w/Reflex if Positive   4. Screening for HIV (human immunodeficiency virus)   - HIV antibody (with reflex)   5. Encounter for HCV screening test for low risk patient   - HCV Ab w Reflex to Quant PCR   6. Screening for metabolic disorder   - CBC with Differential/Platelet - Comp. Metabolic Panel (12)  ROS per HPI     Health Maintenance:  Health Maintenance Due  Topic Date Due   COVID-19 Vaccine (1) Never done   Hepatitis C Screening  Never done   TETANUS/TDAP  Never done     Past Medical History: There are no problems to display for this patient.     Social History   reports that he has been smoking cigars and cigarettes. He has been smoking an average of .5 packs per day. He has never used smokeless tobacco. He reports that he does not drink alcohol and does not use drugs.   Family History  family history is not on file.   Medications: reviewed and updated   Objective:   Physical Exam There were no  vitals taken for this visit. Physical Exam      Assessment & Plan:         Patient was given clear instructions to go to Emergency Department or return to medical center if symptoms don't improve, worsen, or new problems develop.The patient verbalized understanding.  I discussed the assessment and treatment plan with the patient. The patient was provided an opportunity to ask questions and all were answered. The patient agreed with the plan and demonstrated an understanding of the instructions.   The patient was advised to call back or seek an in-person evaluation if the symptoms worsen or if the condition fails to improve as anticipated.    Ricky Stabs, NP 03/13/2022, 6:58 PM Primary Care at [facility]

## 2022-03-14 NOTE — Progress Notes (Addendum)
Subjective:    Donald Rhodes - 46 y.o. male MRN [id number]  Date of birth: Sep 12, 1975  HPI  Donald Rhodes is to establish care.   Current issues and/or concerns: Right knee pain persisting. Reports awaiting financial assistance approval so that he can establish with Orthopedics. States when Orthopedics called to schedule appointment they are asking for $100 copay of which he does not have at this time. Taking over-the-counter Ibuprofen and Tylenol as needed for pain management. Using cane to assist with ambulation.    ROS per HPI     Health Maintenance:  Health Maintenance Due  Topic Date Due   COVID-19 Vaccine (1) Never done   INFLUENZA VACCINE  03/20/2022     Past Medical History: There are no problems to display for this patient.   Social History   reports that he has been smoking cigars and cigarettes. He has been smoking an average of .5 packs per day. He has been exposed to tobacco smoke. He has never used smokeless tobacco. He reports that he does not drink alcohol and does not use drugs.   Family History  family history is not on file.   Medications: reviewed and updated   Objective:   Physical Exam BP 132/72 (BP Location: Left Arm, Patient Position: Sitting, Cuff Size: Normal)   Pulse 69   Temp 98.3 F (36.8 C)   Resp 18   Ht 5\' 6"  (1.676 m)   Wt 130 lb (59 kg)   SpO2 96%   BMI 20.98 kg/m  Physical Exam HENT:     Head: Normocephalic and atraumatic.  Eyes:     Extraocular Movements: Extraocular movements intact.     Conjunctiva/sclera: Conjunctivae normal.     Pupils: Pupils are equal, round, and reactive to light.  Cardiovascular:     Rate and Rhythm: Normal rate and regular rhythm.     Pulses: Normal pulses.     Heart sounds: Normal heart sounds.  Pulmonary:     Effort: Pulmonary effort is normal.     Breath sounds: Normal breath sounds.  Musculoskeletal:     Cervical back: Normal range of motion and neck supple.  Neurological:      General: No focal deficit present.     Mental Status: He is alert and oriented to person, place, and time.  Psychiatric:        Mood and Affect: Mood normal.        Behavior: Behavior normal.        Assessment & Plan:  1. Encounter to establish care - Patient presents today to establish care.  - Return for annual physical examination, labs, and health maintenance. Arrive fasting meaning having no food for at least 8 hours prior to appointment. You may have only water or black coffee. Please take scheduled medications as normal.  2. Acute pain of right knee - Meloxicam and Prednisone as prescribed. Counseled on medication adherence and adverse effects.  - Patient reports plans to establish with Orthopedics soon as he is approved for financial assistance or once he has copy whichever comes first.  - Follow-up with primary provider as scheduled.  - meloxicam (MOBIC) 7.5 MG tablet; Take 1 tablet (7.5 mg total) by mouth daily.  Dispense: 30 tablet; Refill: 2 - predniSONE (DELTASONE) 10 MG tablet; Take 6 tablets (60 mg total) by mouth daily with breakfast for 1 day, THEN 5 tablets (50 mg total) daily with breakfast for 1 day, THEN 4 tablets (40 mg total) daily with  breakfast for 1 day, THEN 3 tablets (30 mg total) daily with breakfast for 1 day, THEN 2 tablets (20 mg total) daily with breakfast for 1 day, THEN 1 tablet (10 mg total) daily with breakfast for 1 day.  Dispense: 21 tablet; Refill: 0   Patient was given clear instructions to go to Emergency Department or return to medical center if symptoms don't improve, worsen, or new problems develop.The patient verbalized understanding.  I discussed the assessment and treatment plan with the patient. The patient was provided an opportunity to ask questions and all were answered. The patient agreed with the plan and demonstrated an understanding of the instructions.   The patient was advised to call back or seek an in-person evaluation if the  symptoms worsen or if the condition fails to improve as anticipated.    Ricky Stabs, NP 03/20/2022, 4:18 PM Primary Care at [facility]

## 2022-03-15 LAB — CBC WITH DIFFERENTIAL/PLATELET
Basophils Absolute: 0.1 10*3/uL (ref 0.0–0.2)
Basos: 1 %
EOS (ABSOLUTE): 0.2 10*3/uL (ref 0.0–0.4)
Eos: 2 %
Hematocrit: 43.1 % (ref 37.5–51.0)
Hemoglobin: 15.1 g/dL (ref 13.0–17.7)
Immature Grans (Abs): 0 10*3/uL (ref 0.0–0.1)
Immature Granulocytes: 0 %
Lymphocytes Absolute: 3.4 10*3/uL — ABNORMAL HIGH (ref 0.7–3.1)
Lymphs: 32 %
MCH: 29.9 pg (ref 26.6–33.0)
MCHC: 35 g/dL (ref 31.5–35.7)
MCV: 85 fL (ref 79–97)
Monocytes Absolute: 0.7 10*3/uL (ref 0.1–0.9)
Monocytes: 6 %
Neutrophils Absolute: 6.2 10*3/uL (ref 1.4–7.0)
Neutrophils: 59 %
Platelets: 256 10*3/uL (ref 150–450)
RBC: 5.05 x10E6/uL (ref 4.14–5.80)
RDW: 14.8 % (ref 11.6–15.4)
WBC: 10.7 10*3/uL (ref 3.4–10.8)

## 2022-03-15 LAB — COMP. METABOLIC PANEL (12)
AST: 17 IU/L (ref 0–40)
Albumin/Globulin Ratio: 1.7 (ref 1.2–2.2)
Albumin: 4.5 g/dL (ref 4.1–5.1)
Alkaline Phosphatase: 85 IU/L (ref 44–121)
BUN/Creatinine Ratio: 20 (ref 9–20)
BUN: 15 mg/dL (ref 6–24)
Bilirubin Total: 0.3 mg/dL (ref 0.0–1.2)
Calcium: 9.7 mg/dL (ref 8.7–10.2)
Chloride: 106 mmol/L (ref 96–106)
Creatinine, Ser: 0.76 mg/dL (ref 0.76–1.27)
Globulin, Total: 2.6 g/dL (ref 1.5–4.5)
Glucose: 80 mg/dL (ref 70–99)
Potassium: 4.3 mmol/L (ref 3.5–5.2)
Sodium: 143 mmol/L (ref 134–144)
Total Protein: 7.1 g/dL (ref 6.0–8.5)
eGFR: 114 mL/min/{1.73_m2} (ref >59)

## 2022-03-15 LAB — ANA W/REFLEX IF POSITIVE: Anti Nuclear Antibody (ANA): NEGATIVE

## 2022-03-15 LAB — HCV AB W REFLEX TO QUANT PCR: HCV Ab: NONREACTIVE

## 2022-03-15 LAB — HIV ANTIBODY (ROUTINE TESTING W REFLEX): HIV Screen 4th Generation wRfx: NONREACTIVE

## 2022-03-15 LAB — HCV INTERPRETATION

## 2022-03-15 LAB — LYME DISEASE SEROLOGY W/REFLEX: Lyme Total Antibody EIA: NEGATIVE

## 2022-03-20 ENCOUNTER — Telehealth: Payer: Self-pay | Admitting: Family

## 2022-03-20 ENCOUNTER — Ambulatory Visit (INDEPENDENT_AMBULATORY_CARE_PROVIDER_SITE_OTHER): Payer: Self-pay | Admitting: Family

## 2022-03-20 ENCOUNTER — Encounter: Payer: Self-pay | Admitting: Family

## 2022-03-20 VITALS — BP 132/72 | HR 69 | Temp 98.3°F | Resp 18 | Ht 66.0 in | Wt 130.0 lb

## 2022-03-20 DIAGNOSIS — M25561 Pain in right knee: Secondary | ICD-10-CM

## 2022-03-20 DIAGNOSIS — Z7689 Persons encountering health services in other specified circumstances: Secondary | ICD-10-CM

## 2022-03-20 MED ORDER — PREDNISONE 10 MG PO TABS: ORAL_TABLET | ORAL | 0 refills | Status: AC

## 2022-03-20 MED ORDER — MELOXICAM 7.5 MG PO TABS: 7.5000 mg | ORAL_TABLET | Freq: Every day | ORAL | 2 refills | Status: DC

## 2022-03-20 NOTE — Progress Notes (Signed)
Pt presents for right knee pain

## 2022-03-20 NOTE — Addendum Note (Signed)
Addended by: [name] on: 03/20/2022 04:18 PM   Modules accepted: Orders

## 2022-03-21 ENCOUNTER — Ambulatory Visit: Payer: Self-pay | Admitting: Family

## 2022-03-21 DIAGNOSIS — Z7689 Persons encountering health services in other specified circumstances: Secondary | ICD-10-CM

## 2022-03-22 NOTE — Telephone Encounter (Signed)
I called patient # [phone] regarding housing, message left with call back requested.

## 2022-03-28 NOTE — Telephone Encounter (Signed)
I tried to contact  patient again # [phone] regarding housing, message left with call back requested.

## 2022-04-08 ENCOUNTER — Emergency Department (HOSPITAL_COMMUNITY)
Admission: EM | Admit: 2022-04-08 | Discharge: 2022-04-08 | Discharge status: Against Medical Advice | Payer: Self-pay | Attending: Emergency Medicine | Admitting: Emergency Medicine

## 2022-04-08 ENCOUNTER — Encounter (HOSPITAL_COMMUNITY): Payer: Self-pay

## 2022-04-08 ENCOUNTER — Other Ambulatory Visit: Payer: Self-pay

## 2022-04-08 DIAGNOSIS — R112 Nausea with vomiting, unspecified: Secondary | ICD-10-CM | POA: Insufficient documentation

## 2022-04-08 DIAGNOSIS — R109 Unspecified abdominal pain: Secondary | ICD-10-CM | POA: Insufficient documentation

## 2022-04-08 DIAGNOSIS — Z5321 Procedure and treatment not carried out due to patient leaving prior to being seen by health care provider: Secondary | ICD-10-CM | POA: Insufficient documentation

## 2022-04-08 LAB — CBC WITH DIFFERENTIAL/PLATELET
Abs Immature Granulocytes: 0.05 10*3/uL (ref 0.00–0.07)
Basophils Absolute: 0.1 10*3/uL (ref 0.0–0.1)
Basophils Relative: 1 %
Eosinophils Absolute: 0 10*3/uL (ref 0.0–0.5)
Eosinophils Relative: 0 %
HCT: 46.5 % (ref 39.0–52.0)
Hemoglobin: 15.6 g/dL (ref 13.0–17.0)
Immature Granulocytes: 1 %
Lymphocytes Relative: 14 %
Lymphs Abs: 1.5 10*3/uL (ref 0.7–4.0)
MCH: 29.3 pg (ref 26.0–34.0)
MCHC: 33.5 g/dL (ref 30.0–36.0)
MCV: 87.2 fL (ref 80.0–100.0)
Monocytes Absolute: 0.6 10*3/uL (ref 0.1–1.0)
Monocytes Relative: 6 %
Neutro Abs: 8.3 10*3/uL — ABNORMAL HIGH (ref 1.7–7.7)
Neutrophils Relative %: 78 %
Platelets: 286 10*3/uL (ref 150–400)
RBC: 5.33 MIL/uL (ref 4.22–5.81)
RDW: 14.2 % (ref 11.5–15.5)
WBC: 10.6 10*3/uL — ABNORMAL HIGH (ref 4.0–10.5)
nRBC: 0 % (ref 0.0–0.2)

## 2022-04-08 LAB — COMPREHENSIVE METABOLIC PANEL
ALT: 12 U/L (ref 0–44)
AST: 16 U/L (ref 15–41)
Albumin: 4.3 g/dL (ref 3.5–5.0)
Alkaline Phosphatase: 86 U/L (ref 38–126)
Anion gap: 10 (ref 5–15)
BUN: 17 mg/dL (ref 6–20)
CO2: 25 mmol/L (ref 22–32)
Calcium: 9.6 mg/dL (ref 8.9–10.3)
Chloride: 104 mmol/L (ref 98–111)
Creatinine, Ser: 0.75 mg/dL (ref 0.61–1.24)
GFR, Estimated: >60 mL/min (ref >60)
Glucose, Bld: 124 mg/dL — ABNORMAL HIGH (ref 70–99)
Potassium: 3.7 mmol/L (ref 3.5–5.1)
Sodium: 139 mmol/L (ref 135–145)
Total Bilirubin: 1.3 mg/dL — ABNORMAL HIGH (ref 0.3–1.2)
Total Protein: 6.9 g/dL (ref 6.5–8.1)

## 2022-04-08 LAB — LIPASE, BLOOD: Lipase: 28 U/L (ref 11–51)

## 2022-04-08 NOTE — ED Notes (Signed)
Called no answer X2 

## 2022-04-08 NOTE — ED Notes (Signed)
Called X3 no answer an do not see pt in lobby

## 2022-04-08 NOTE — ED Provider Triage Note (Signed)
Emergency Medicine Provider Triage Evaluation Note  Donald Rhodes , a 46 y.o. male  was evaluated in triage.  Pt complains of nausea, vomiting.  Unable to tolerate p.o. intake.  Symptoms ongoing since Friday evening.  States this was after he ate [employer].  States he had some abdominal discomfort but currently resolved.  Denies diarrhea.  Review of Systems  Positive: As above Negative: As above  Physical Exam  BP 117/83 (BP Location: Right Arm)   Pulse 83   Temp 98.4 F (36.9 C) (Oral)   Resp 16   SpO2 97%  Gen:   Awake, no distress   Resp:  Normal effort  MSK:   Moves extremities without difficulty  Other:    Medical Decision Making  Medically screening exam initiated at 2:30 PM.  Appropriate orders placed.  Drusilla Kanner was informed that the remainder of the evaluation will be completed by another provider, this initial triage assessment does not replace that evaluation, and the importance of remaining in the ED until their evaluation is complete.     [name], [name] 04/08/22 1431

## 2022-04-08 NOTE — ED Notes (Signed)
Called X3 no answer and I do not see pt in lobby

## 2022-04-08 NOTE — ED Triage Notes (Signed)
Pt BIB [facility] from a restaurant c/o N/V since Friday and has not been able to keep anything down.

## 2022-04-12 NOTE — Telephone Encounter (Signed)
I tried again to contact the patient  # [phone] regarding housing and the recording stated that the call could not be completed at dialed. Three attempts made to try to reach him today.

## 2022-04-17 NOTE — Telephone Encounter (Signed)
I tried to reach patient again, [phone], Thayer Ohm answered and said he would have the patient call me back.. the patient was not with him at this time

## 2022-06-01 ENCOUNTER — Ambulatory Visit
Admission: EM | Admit: 2022-06-01 | Discharge: 2022-06-01 | Disposition: A | Discharge status: Home / Self Care | Payer: Self-pay

## 2022-06-01 DIAGNOSIS — R112 Nausea with vomiting, unspecified: Secondary | ICD-10-CM

## 2022-06-01 LAB — POCT URINALYSIS DIP (MANUAL ENTRY)
Glucose, UA: NEGATIVE mg/dL
Nitrite, UA: NEGATIVE
Protein Ur, POC: 100 mg/dL — AB
Spec Grav, UA: 1.02 (ref 1.010–1.025)
Urobilinogen, UA: 1 E.U./dL
pH, UA: 7 (ref 5.0–8.0)

## 2022-06-01 MED ORDER — SODIUM CHLORIDE 0.9 % IV BOLUS
1000.0000 mL | Freq: Once | INTRAVENOUS | Status: AC
  Administered 2022-06-01: 1000 mL via INTRAVENOUS

## 2022-06-01 MED ORDER — ONDANSETRON HCL 4 MG/2ML IJ SOLN
4.0000 mg | Freq: Once | INTRAMUSCULAR | Status: AC
  Administered 2022-06-01: 4 mg via INTRAMUSCULAR

## 2022-06-01 MED ORDER — ONDANSETRON 4 MG PO TBDP
4.0000 mg | ORAL_TABLET | Freq: Once | ORAL | Status: AC
  Administered 2022-06-01: 4 mg via ORAL

## 2022-06-01 MED ORDER — ONDANSETRON 4 MG PO TBDP: 4.0000 mg | ORAL_TABLET | Freq: Three times a day (TID) | ORAL | 0 refills | Status: DC | PRN

## 2022-06-01 NOTE — ED Triage Notes (Signed)
Pt presents to [facility] with co of nausea and vomiting, abdominal pain since 1:30 am. Pt reports he woke up and has been vomiting since. Pt reports he had a peanut butter sandwich and tuna yesterday, pt reports vomit was mainly food.

## 2022-06-01 NOTE — ED Notes (Signed)
Pt given water and crackers for po trial. Pa aware.

## 2022-06-01 NOTE — Discharge Instructions (Signed)
As we discussed, if you have any recurrent nausea and vomiting I do think you need to go to the emergency room for further evaluation and management.  Use Zofran every 8 hours as needed.  Eat a bland diet and drink plenty of fluid.  If anything worsens and you have abdominal pain, fever, blood in your stool, blood in your vomit you need to go to the ER.  We will contact you for lab work is abnormal.

## 2022-06-01 NOTE — ED Provider Notes (Signed)
[name]    CSN: [id number] Arrival date & time: 06/01/22  8338      History   Chief Complaint Chief Complaint  Patient presents with   Nausea   Abdominal Pain   Emesis    HPI Donald Rhodes is a 46 y.o. male.   Patient presents today with a several hour history of persistent nausea and vomiting.  Reports initially he had some abdominal upset and had to use the bathroom.  Soon after this he developed nausea and vomiting that has been ongoing since approximately 1:30 AM.  He reports that his abdominal pain has resolved and he has not had any diarrhea.  Denies any hematemesis.  Reports that he did eat peanut butter and jelly as well as tunafish yesterday and wonders if this could have triggered symptoms.  He does report that he is currently without home.  He denies any history of gastrointestinal disorder.  He does use ibuprofen to help manage pain but denies any increase in NSAID use.  He denies any recent travel, known sick contacts.  He has been having difficulty keeping even water down since symptoms began.  He denies any chest pain, shortness of breath, fever, weakness.    Past Medical History:  Diagnosis Date   Irregular heart beat    Rheumatoid arthritis ([place])     There are no problems to display for this patient.   No past surgical history on file.     Home Medications    Prior to Admission medications   Medication Sig Start Date End Date Taking? Authorizing Provider  ondansetron (ZOFRAN-ODT) 4 MG disintegrating tablet Take 1 tablet (4 mg total) by mouth every 8 (eight) hours as needed for nausea or vomiting. 06/01/22  Yes Bretta Fees, Derry Skill, [name]    Family History No family history on file.  Social History Social History   Tobacco Use   Smoking status: Every Day    Packs/day: 0.50    Types: Cigars, Cigarettes    Passive exposure: Current   Smokeless tobacco: Never  Vaping Use   Vaping Use: Never used  Substance Use Topics   Alcohol  use: No   Drug use: No     Allergies   Patient has no known allergies.   Review of Systems Review of Systems  Constitutional:  Positive for activity change and appetite change. Negative for fatigue and fever.  Respiratory:  Negative for cough and shortness of breath.   Cardiovascular:  Negative for chest pain.  Gastrointestinal:  Positive for nausea and vomiting. Negative for abdominal pain, blood in stool, constipation and diarrhea.  Neurological:  Negative for dizziness, light-headedness, numbness and headaches.     Physical Exam Triage Vital Signs ED Triage Vitals  Enc Vitals Group     BP 06/01/22 0851 121/72     Pulse Rate 06/01/22 0851 (!) 56     Resp 06/01/22 0851 18     Temp 06/01/22 0851 97.6 F (36.4 C)     Temp src --      SpO2 06/01/22 0851 97 %     Weight --      Height --      Head Circumference --      Peak Flow --      Pain Score 06/01/22 0850 0     Pain Loc --      Pain Edu? --      Excl. in [place]? --    No data found.  Updated Vital  Signs BP 125/71   Pulse 65   Temp 98 F (36.7 C)   Resp 18   SpO2 98%   Visual Acuity Right Eye Distance:   Left Eye Distance:   Bilateral Distance:    Right Eye Near:   Left Eye Near:    Bilateral Near:     Physical Exam Vitals reviewed.  Constitutional:      General: He is awake.     Appearance: Normal appearance. He is well-developed. He is ill-appearing.     Comments: Very pleasant male appears stated age sitting on exam room table holding emesis bag  HENT:     Head: Normocephalic and atraumatic.     Mouth/Throat:     Mouth: Mucous membranes are moist.     Pharynx: Uvula midline. No oropharyngeal exudate, posterior oropharyngeal erythema or uvula swelling.  Cardiovascular:     Rate and Rhythm: Normal rate and regular rhythm.     Heart sounds: Normal heart sounds, S1 normal and S2 normal. No murmur heard. Pulmonary:     Effort: Pulmonary effort is normal.     Breath sounds: Normal breath sounds. No  stridor. No wheezing, rhonchi or rales.     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     Palpations: Abdomen is soft.     Tenderness: There is no abdominal tenderness. There is no right CVA tenderness, left CVA tenderness, guarding or rebound.     Comments: Benign abdominal exam; no significant tenderness palpation.  Neurological:     Mental Status: He is alert.  Psychiatric:        Behavior: Behavior is cooperative.      UC Treatments / Results  Labs (all labs ordered are listed, but only abnormal results are displayed) Labs Reviewed  POCT URINALYSIS DIP (MANUAL ENTRY) - Abnormal; Notable for the following components:      Result Value   Bilirubin, UA small (*)    Ketones, POC UA moderate (40) (*)    Blood, UA small (*)    Protein Ur, POC =100 (*)    Leukocytes, UA Trace (*)    All other components within normal limits  COMPREHENSIVE METABOLIC PANEL  CBC WITH DIFFERENTIAL/PLATELET  LIPASE    EKG   Radiology No results found.  Procedures Procedures (including critical care time)  Medications Ordered in UC Medications  ondansetron (ZOFRAN-ODT) disintegrating tablet 4 mg (4 mg Oral Given 06/01/22 0855)  ondansetron (ZOFRAN) injection 4 mg (4 mg Intramuscular Given 06/01/22 0927)  sodium chloride 0.9 % bolus 1,000 mL (0 mLs Intravenous Stopped 06/01/22 1101)    Initial Impression / Assessment and Plan / UC Course  I have reviewed the triage vital signs and the nursing notes.  Pertinent labs & imaging results that were available during my care of the patient were reviewed by me and considered in my medical decision making (see chart for details).     Patient was initially given ODT Zofran which he immediately vomited.  He was then given an injection of Zofran which lasted for several minutes until he had recurrent symptoms.  We discussed potential  utility of going to the emergency room but patient reports that he was not willing to do this as he has had bad experiences in the past and would not have a way to get home.  Ultimately he agreed to a liter of fluids which  did initially provide some relief but he did have recurrent nausea.  He was able to tolerate some crackers and water without vomiting but continued to feel poorly.  He denies any significant abdominal pain or fever.  Basic labs including CBC, CMP, lipase were obtained.  We discussed several times for potential utility of calling EMS to transport to the emergency room for further evaluation and management but he continued to decline this.  He does have a cell phone and agreed to call 911 if his symptoms or not improving quickly.  He was stable at the time of discharge with normal vital signs.  We discussed at length alarm symptoms that would warrant calling 911/going to the emergency room to which he expressed understanding.  He was encouraged to push fluids and eat a bland diet.  All questions were answered to patient's satisfaction.  Prescription for ODT Zofran sent to pharmacy.  Final Clinical Impressions(s) / UC Diagnoses   Final diagnoses:  Intractable nausea and vomiting     Discharge Instructions      As we discussed, if you have any recurrent nausea and vomiting I do think you need to go to the emergency room for further evaluation and management.  Use Zofran every 8 hours as needed.  Eat a bland diet and drink plenty of fluid.  If anything worsens and you have abdominal pain, fever, blood in your stool, blood in your vomit you need to go to the ER.  We will contact you for lab work is abnormal.     ED Prescriptions     Medication Sig Dispense Auth. Provider   ondansetron (ZOFRAN-ODT) 4 MG disintegrating tablet Take 1 tablet (4 mg total) by mouth every 8 (eight) hours as needed for nausea or vomiting. 20 tablet Alesha Jaffee, Derry Skill, [name]      PDMP not reviewed this  encounter.   Terrilee Croak, [name] 06/01/22 1125

## 2022-06-01 NOTE — ED Notes (Signed)
Pt expressed concern for financial responsibility if he needs to go to the emergency room, pt concerned for getting back home as well. Pt expressed concern for bilateral knee pain and inability to afford meloxicam prescription. Difficulty walking on right knee using cane and 800 mg ibuprofen as needed. Pt wants to see if he can get another prescription for meloxicam because it was helping. Nurse made pa aware of patient concerns.

## 2022-06-01 NOTE — ED Notes (Signed)
Attempted PIV in L forearm unsuccessful.

## 2022-06-02 LAB — CBC WITH DIFFERENTIAL/PLATELET
Basophils Absolute: 0.1 10*3/uL (ref 0.0–0.2)
Basos: 1 %
EOS (ABSOLUTE): 0 10*3/uL (ref 0.0–0.4)
Eos: 0 %
Hematocrit: 44.3 % (ref 37.5–51.0)
Hemoglobin: 15 g/dL (ref 13.0–17.7)
Immature Grans (Abs): 0 10*3/uL (ref 0.0–0.1)
Immature Granulocytes: 0 %
Lymphocytes Absolute: 1.3 10*3/uL (ref 0.7–3.1)
Lymphs: 12 %
MCH: 29.6 pg (ref 26.6–33.0)
MCHC: 33.9 g/dL (ref 31.5–35.7)
MCV: 87 fL (ref 79–97)
Monocytes Absolute: 0.4 10*3/uL (ref 0.1–0.9)
Monocytes: 4 %
Neutrophils Absolute: 8.8 10*3/uL — ABNORMAL HIGH (ref 1.4–7.0)
Neutrophils: 83 %
Platelets: 315 10*3/uL (ref 150–450)
RBC: 5.07 x10E6/uL (ref 4.14–5.80)
RDW: 13.6 % (ref 11.6–15.4)
WBC: 10.6 10*3/uL (ref 3.4–10.8)

## 2022-06-02 LAB — LIPASE: Lipase: 18 U/L (ref 13–78)

## 2022-06-02 LAB — COMPREHENSIVE METABOLIC PANEL
ALT: 13 IU/L (ref 0–44)
AST: 17 IU/L (ref 0–40)
Albumin/Globulin Ratio: 2.2 (ref 1.2–2.2)
Albumin: 4.6 g/dL (ref 4.1–5.1)
Alkaline Phosphatase: 97 IU/L (ref 44–121)
BUN/Creatinine Ratio: 22 — ABNORMAL HIGH (ref 9–20)
BUN: 15 mg/dL (ref 6–24)
Bilirubin Total: 0.4 mg/dL (ref 0.0–1.2)
CO2: 24 mmol/L (ref 20–29)
Calcium: 9.8 mg/dL (ref 8.7–10.2)
Chloride: 107 mmol/L — ABNORMAL HIGH (ref 96–106)
Creatinine, Ser: 0.67 mg/dL — ABNORMAL LOW (ref 0.76–1.27)
Globulin, Total: 2.1 g/dL (ref 1.5–4.5)
Glucose: 99 mg/dL (ref 70–99)
Potassium: 4.6 mmol/L (ref 3.5–5.2)
Sodium: 145 mmol/L — ABNORMAL HIGH (ref 134–144)
Total Protein: 6.7 g/dL (ref 6.0–8.5)
eGFR: 118 mL/min/{1.73_m2} (ref >59)

## 2022-06-03 ENCOUNTER — Emergency Department (HOSPITAL_COMMUNITY)
Admission: EM | Admit: 2022-06-03 | Discharge: 2022-06-04 | Disposition: A | Discharge status: Home / Self Care | Payer: Self-pay | Attending: Emergency Medicine | Admitting: Emergency Medicine

## 2022-06-03 ENCOUNTER — Encounter (HOSPITAL_COMMUNITY): Payer: Self-pay

## 2022-06-03 ENCOUNTER — Other Ambulatory Visit: Payer: Self-pay

## 2022-06-03 ENCOUNTER — Encounter (HOSPITAL_COMMUNITY): Payer: Self-pay | Admitting: Emergency Medicine

## 2022-06-03 ENCOUNTER — Emergency Department (HOSPITAL_COMMUNITY): Payer: Self-pay

## 2022-06-03 ENCOUNTER — Emergency Department (HOSPITAL_COMMUNITY)
Admission: EM | Admit: 2022-06-03 | Discharge: 2022-06-03 | Discharge status: Against Medical Advice | Payer: Self-pay | Attending: Emergency Medicine | Admitting: Emergency Medicine

## 2022-06-03 DIAGNOSIS — R112 Nausea with vomiting, unspecified: Secondary | ICD-10-CM

## 2022-06-03 DIAGNOSIS — D72829 Elevated white blood cell count, unspecified: Secondary | ICD-10-CM | POA: Insufficient documentation

## 2022-06-03 DIAGNOSIS — E876 Hypokalemia: Secondary | ICD-10-CM | POA: Insufficient documentation

## 2022-06-03 DIAGNOSIS — R34 Anuria and oliguria: Secondary | ICD-10-CM | POA: Insufficient documentation

## 2022-06-03 DIAGNOSIS — Z5321 Procedure and treatment not carried out due to patient leaving prior to being seen by health care provider: Secondary | ICD-10-CM | POA: Insufficient documentation

## 2022-06-03 LAB — COMPREHENSIVE METABOLIC PANEL
ALT: 17 U/L (ref 0–44)
ALT: 17 U/L (ref 0–44)
AST: 20 U/L (ref 15–41)
AST: 18 U/L (ref 15–41)
Albumin: 4.3 g/dL (ref 3.5–5.0)
Albumin: 4.3 g/dL (ref 3.5–5.0)
Alkaline Phosphatase: 77 U/L (ref 38–126)
Alkaline Phosphatase: 75 U/L (ref 38–126)
Anion gap: 12 (ref 5–15)
Anion gap: 14 (ref 5–15)
BUN: 21 mg/dL — ABNORMAL HIGH (ref 6–20)
BUN: 22 mg/dL — ABNORMAL HIGH (ref 6–20)
CO2: 26 mmol/L (ref 22–32)
CO2: 27 mmol/L (ref 22–32)
Calcium: 9.3 mg/dL (ref 8.9–10.3)
Calcium: 9.6 mg/dL (ref 8.9–10.3)
Chloride: 105 mmol/L (ref 98–111)
Chloride: 101 mmol/L (ref 98–111)
Creatinine, Ser: 0.72 mg/dL (ref 0.61–1.24)
Creatinine, Ser: 0.92 mg/dL (ref 0.61–1.24)
GFR, Estimated: >60 mL/min (ref >60)
GFR, Estimated: >60 mL/min (ref >60)
Glucose, Bld: 109 mg/dL — ABNORMAL HIGH (ref 70–99)
Glucose, Bld: 104 mg/dL — ABNORMAL HIGH (ref 70–99)
Potassium: 3.2 mmol/L — ABNORMAL LOW (ref 3.5–5.1)
Potassium: 3.3 mmol/L — ABNORMAL LOW (ref 3.5–5.1)
Sodium: 143 mmol/L (ref 135–145)
Sodium: 142 mmol/L (ref 135–145)
Total Bilirubin: 1.2 mg/dL (ref 0.3–1.2)
Total Bilirubin: 1.3 mg/dL — ABNORMAL HIGH (ref 0.3–1.2)
Total Protein: 7.2 g/dL (ref 6.5–8.1)
Total Protein: 6.9 g/dL (ref 6.5–8.1)

## 2022-06-03 LAB — URINALYSIS, ROUTINE W REFLEX MICROSCOPIC
Bilirubin Urine: NEGATIVE
Glucose, UA: NEGATIVE mg/dL
Ketones, ur: 80 mg/dL — AB
Nitrite: NEGATIVE
Protein, ur: 100 mg/dL — AB
RBC / HPF: >50 RBC/hpf — ABNORMAL HIGH (ref 0–5)
Specific Gravity, Urine: 1.028 (ref 1.005–1.030)
pH: 6 (ref 5.0–8.0)

## 2022-06-03 LAB — CBC WITH DIFFERENTIAL/PLATELET
Abs Immature Granulocytes: 0.07 10*3/uL (ref 0.00–0.07)
Basophils Absolute: 0.1 10*3/uL (ref 0.0–0.1)
Basophils Relative: 1 %
Eosinophils Absolute: 0 10*3/uL (ref 0.0–0.5)
Eosinophils Relative: 0 %
HCT: 44.6 % (ref 39.0–52.0)
Hemoglobin: 15 g/dL (ref 13.0–17.0)
Immature Granulocytes: 0 %
Lymphocytes Relative: 11 %
Lymphs Abs: 1.7 10*3/uL (ref 0.7–4.0)
MCH: 29.4 pg (ref 26.0–34.0)
MCHC: 33.6 g/dL (ref 30.0–36.0)
MCV: 87.5 fL (ref 80.0–100.0)
Monocytes Absolute: 1 10*3/uL (ref 0.1–1.0)
Monocytes Relative: 6 %
Neutro Abs: 13.4 10*3/uL — ABNORMAL HIGH (ref 1.7–7.7)
Neutrophils Relative %: 82 %
Platelets: 291 10*3/uL (ref 150–400)
RBC: 5.1 MIL/uL (ref 4.22–5.81)
RDW: 13.5 % (ref 11.5–15.5)
WBC: 16.2 10*3/uL — ABNORMAL HIGH (ref 4.0–10.5)
nRBC: 0 % (ref 0.0–0.2)

## 2022-06-03 LAB — CBC
HCT: 44.6 % (ref 39.0–52.0)
Hemoglobin: 14.9 g/dL (ref 13.0–17.0)
MCH: 29.5 pg (ref 26.0–34.0)
MCHC: 33.4 g/dL (ref 30.0–36.0)
MCV: 88.3 fL (ref 80.0–100.0)
Platelets: 291 10*3/uL (ref 150–400)
RBC: 5.05 MIL/uL (ref 4.22–5.81)
RDW: 13.7 % (ref 11.5–15.5)
WBC: 12.5 10*3/uL — ABNORMAL HIGH (ref 4.0–10.5)
nRBC: 0 % (ref 0.0–0.2)

## 2022-06-03 LAB — LIPASE, BLOOD
Lipase: 25 U/L (ref 11–51)
Lipase: 31 U/L (ref 11–51)

## 2022-06-03 MED ORDER — ONDANSETRON 8 MG PO TBDP
8.0000 mg | ORAL_TABLET | Freq: Once | ORAL | Status: AC
  Administered 2022-06-03: 8 mg via ORAL
  Filled 2022-06-03: qty 1

## 2022-06-03 MED ORDER — ONDANSETRON 4 MG PO TBDP
4.0000 mg | ORAL_TABLET | Freq: Once | ORAL | Status: AC
  Administered 2022-06-03: 4 mg via ORAL
  Filled 2022-06-03: qty 1

## 2022-06-03 MED ORDER — METOCLOPRAMIDE HCL 5 MG/ML IJ SOLN: 10.0000 mg | Freq: Once | INTRAMUSCULAR | Status: DC

## 2022-06-03 NOTE — ED Provider Triage Note (Signed)
Emergency Medicine Provider Triage Evaluation Note  Donald Rhodes , a 46 y.o. male  was evaluated in triage.  Pt complains of emesis. Endorse persistent nausea and vomiting x 2-3 days. Seen at [facility] 2 days ago for same, had some improvement with zofran but sxs return.  Report mild skin changes at umbilicus but denies abd pain, fever, chills, dysuria, bowel/bladder changes  Review of Systems  Positive: As above Negative: As above  Physical Exam  BP (!) 144/67 (BP Location: Left Arm)   Pulse 60   Temp 97.9 F (36.6 C)   Resp 18   SpO2 99%  Gen:   Awake, no distress   Resp:  Normal effort  MSK:   Moves extremities without difficulty  Other:    Medical Decision Making  Medically screening exam initiated at 9:22 PM.  Appropriate orders placed.  Clelia Schaumann was informed that the remainder of the evaluation will be completed by another provider, this initial triage assessment does not replace that evaluation, and the importance of remaining in the ED until their evaluation is complete.     Domenic Moras, [name] 06/03/22 2123

## 2022-06-03 NOTE — ED Triage Notes (Signed)
Per EMS, pt is homeless and picked up on the street.  He is complaining of n/v since Friday, was given temp relief at [facility] when given zofran but it only lasted two hours.  Pt also reports on bowel movements since Friay  122/88 66P 95%RA CBG 117

## 2022-06-03 NOTE — ED Triage Notes (Signed)
Pt comes via [facility] for n/v that has been going on for several days, seen at [facility] yesterday for the same.

## 2022-06-04 ENCOUNTER — Other Ambulatory Visit (HOSPITAL_COMMUNITY): Payer: Self-pay

## 2022-06-04 ENCOUNTER — Emergency Department (HOSPITAL_COMMUNITY): Payer: Self-pay

## 2022-06-04 LAB — URINALYSIS, ROUTINE W REFLEX MICROSCOPIC
Bilirubin Urine: NEGATIVE
Glucose, UA: NEGATIVE mg/dL
Hgb urine dipstick: NEGATIVE
Ketones, ur: 80 mg/dL — AB
Leukocytes,Ua: NEGATIVE
Nitrite: NEGATIVE
Protein, ur: 30 mg/dL — AB
Specific Gravity, Urine: 1.027 (ref 1.005–1.030)
pH: 6 (ref 5.0–8.0)

## 2022-06-04 MED ORDER — IOHEXOL 350 MG/ML SOLN
100.0000 mL | Freq: Once | INTRAVENOUS | Status: AC | PRN
  Administered 2022-06-04: 75 mL via INTRAVENOUS

## 2022-06-04 MED ORDER — ONDANSETRON HCL 4 MG PO TABS
4.0000 mg | ORAL_TABLET | Freq: Four times a day (QID) | ORAL | 0 refills | Status: DC
  Filled 2022-06-04: qty 12, 3d supply, fill #0

## 2022-06-04 MED ORDER — ONDANSETRON HCL 4 MG/2ML IJ SOLN
4.0000 mg | Freq: Once | INTRAMUSCULAR | Status: AC
  Administered 2022-06-04: 4 mg via INTRAVENOUS
  Filled 2022-06-04: qty 2

## 2022-06-04 MED ORDER — POTASSIUM CHLORIDE CRYS ER 20 MEQ PO TBCR
40.0000 meq | EXTENDED_RELEASE_TABLET | Freq: Once | ORAL | Status: AC
  Administered 2022-06-04: 40 meq via ORAL
  Filled 2022-06-04: qty 2

## 2022-06-04 MED ORDER — SODIUM CHLORIDE 0.9 % IV BOLUS
1000.0000 mL | Freq: Once | INTRAVENOUS | Status: AC
  Administered 2022-06-04: 1000 mL via INTRAVENOUS

## 2022-06-04 MED ORDER — PANTOPRAZOLE SODIUM 40 MG PO TBEC: 40.0000 mg | DELAYED_RELEASE_TABLET | Freq: Every day | ORAL | 0 refills | Status: DC

## 2022-06-04 MED ORDER — ONDANSETRON HCL 4 MG PO TABS: 4.0000 mg | ORAL_TABLET | Freq: Four times a day (QID) | ORAL | 0 refills | Status: DC

## 2022-06-04 MED ORDER — PANTOPRAZOLE SODIUM 40 MG PO TBEC
40.0000 mg | DELAYED_RELEASE_TABLET | Freq: Every day | ORAL | 0 refills | Status: DC
  Filled 2022-06-04: qty 30, 30d supply, fill #0

## 2022-06-04 MED ORDER — PANTOPRAZOLE SODIUM 40 MG IV SOLR
40.0000 mg | Freq: Once | INTRAVENOUS | Status: AC
  Administered 2022-06-04: 40 mg via INTRAVENOUS
  Filled 2022-06-04: qty 10

## 2022-06-04 NOTE — ED Notes (Signed)
Spoke with Social work, going to speak with Midwife to figure out prescriptions for d/c

## 2022-06-04 NOTE — ED Provider Notes (Cosign Needed Addendum)
[facility] EMERGENCY DEPARTMENT Provider Note   CSN: [id number] Arrival date & time: 06/03/22  1932     History  Chief Complaint  Patient presents with   Emesis    Donald Rhodes is a 46 y.o. male, history of rheumatoid arthritis, who presents to the ED secondary to nausea, vomiting x3 days.  He states for the last 3 days he has had intractable nausea and vomiting, was seen in the ER on Friday, and given Zofran with some relief, states that immediately after drinking he started vomiting again.  Has had 5-6 episodes of vomiting a day since then.  Denies any abdominal pain.  However reports reduced urination, and no BM for the last 3 days as he has not been unable to keep anything down except for a few crackers.  Notes that he cannot tolerate this anymore, so he came to the ER.  Does endorse taking around 16 to 1800 mg of ibuprofen daily for his rheumatoid arthritis, last taken 3 days ago.  States he does not know if this is what is causing this.  Does have a pit in his stomach, and just states it feels weak. Denies any fevers or chills. No sick contacts.  Has only urinated 3 times in the last 3 days.      Home Medications Prior to Admission medications   Medication Sig Start Date End Date Taking? Authorizing Provider  ibuprofen (ADVIL) 200 MG tablet Take 800 mg by mouth every 6 (six) hours as needed for mild pain.   Yes [provider]  ondansetron (ZOFRAN) 4 MG tablet Take 1 tablet (4 mg total) by mouth every 6 (six) hours. 06/04/22  Yes Kanan Sobek L, PA  pantoprazole (PROTONIX) 40 MG tablet Take 1 tablet (40 mg total) by mouth daily. 06/04/22  Yes Sebastiano Luecke L, PA  meloxicam (MOBIC) 7.5 MG tablet Take 7.5 mg by mouth daily. Patient not taking: Reported on 06/04/2022    [provider]  ondansetron (ZOFRAN-ODT) 4 MG disintegrating tablet Take 1 tablet (4 mg total) by mouth every 8 (eight) hours as needed for nausea or vomiting. Patient not  taking: Reported on 06/04/2022 06/01/22   Raspet, Noberto Retort, [name]      Allergies    Patient has no known allergies.    Review of Systems   Review of Systems  Gastrointestinal:  Positive for nausea and vomiting.  Genitourinary:  Positive for decreased urine volume. Negative for dysuria and flank pain.    Physical Exam Updated Vital Signs BP 122/68   Pulse 65   Temp 98.2 F (36.8 C) (Oral)   Resp 18   Ht 5\' 6"  (1.676 m)   Wt 59 kg   SpO2 98%   BMI 20.98 kg/m  Physical Exam Vitals and nursing note reviewed.  Constitutional:      General: He is not in acute distress.    Appearance: He is well-developed.  HENT:     Head: Normocephalic and atraumatic.  Eyes:     Conjunctiva/sclera: Conjunctivae normal.  Cardiovascular:     Rate and Rhythm: Normal rate and regular rhythm.     Heart sounds: No murmur heard. Pulmonary:     Effort: Pulmonary effort is normal. No respiratory distress.     Breath sounds: Normal breath sounds.  Abdominal:     Palpations: Abdomen is soft.     Tenderness: There is no abdominal tenderness.  Musculoskeletal:        General: No swelling.  Cervical back: Neck supple.  Skin:    General: Skin is warm and dry.     Capillary Refill: Capillary refill takes less than 2 seconds.  Neurological:     Mental Status: He is alert.  Psychiatric:        Mood and Affect: Mood normal.     ED Results / Procedures / Treatments   Labs (all labs ordered are listed, but only abnormal results are displayed) Labs Reviewed  CBC WITH DIFFERENTIAL/PLATELET - Abnormal; Notable for the following components:      Result Value   WBC 16.2 (*)    Neutro Abs 13.4 (*)    All other components within normal limits  COMPREHENSIVE METABOLIC PANEL - Abnormal; Notable for the following components:   Potassium 3.3 (*)    Glucose, Bld 104 (*)    BUN 22 (*)    Total Bilirubin 1.3 (*)    All other components within normal limits  URINALYSIS, ROUTINE W REFLEX MICROSCOPIC -  Abnormal; Notable for the following components:   Color, Urine AMBER (*)    Ketones, ur 80 (*)    Protein, ur 30 (*)    Bacteria, UA RARE (*)    Non Squamous Epithelial 0-5 (*)    All other components within normal limits  URINE CULTURE  LIPASE, BLOOD    EKG None  Radiology CT ABDOMEN PELVIS W CONTRAST  Result Date: 06/04/2022 CLINICAL DATA:  Intractable nausea and vomiting EXAM: CT ABDOMEN AND PELVIS WITH CONTRAST TECHNIQUE: Multidetector CT imaging of the abdomen and pelvis was performed using the standard protocol following bolus administration of intravenous contrast. RADIATION DOSE REDUCTION: This exam was performed according to the departmental dose-optimization program which includes automated exposure control, adjustment of the mA and/or kV according to patient size and/or use of iterative reconstruction technique. CONTRAST:  29mL OMNIPAQUE IOHEXOL 350 MG/ML SOLN COMPARISON:  Abdominal radiography yesterday. FINDINGS: Lower chest: Lung bases are clear.  No pleural effusion. Hepatobiliary: Liver parenchyma is normal.  No calcified gallstones. Pancreas: Normal Spleen: Normal Adrenals/Urinary Tract: Adrenal glands are normal. Kidneys are normal. No cyst, mass, stone or hydronephrosis. Bladder is normal. Stomach/Bowel: Stomach and Rafaelita Foister intestine are normal. Normal appendix. Normal colon. Vascular/Lymphatic: Aorta and IVC are normal.  No adenopathy. Reproductive: Normal Other: No free fluid or air. Musculoskeletal: Chronic degenerative disc disease L5-S1. No acute finding. IMPRESSION: 1. Normal CT scan of the abdomen and pelvis. No abnormality seen to explain the clinical presentation. 2. Chronic degenerative disc disease L5-S1. Electronically Signed   By: Paulina Fusi M.D.   On: 06/04/2022 12:37   DG ABD ACUTE 2+V W 1V CHEST  Result Date: 06/03/2022 CLINICAL DATA:  Nausea and vomiting, initial encounter EXAM: DG ABDOMEN ACUTE WITH 1 VIEW CHEST COMPARISON:  None Available. FINDINGS:  Cardiac shadow is within normal limits. Lungs are well aerated bilaterally. No focal infiltrate is seen. No acute bony abnormality is noted. Scattered large and Keiasha Diep bowel gas is noted. No free air is seen. No abnormal mass or abnormal calcifications are noted. No bony abnormality is seen. IMPRESSION: No acute abnormality noted. Electronically Signed   By: Alcide Clever M.D.   On: 06/03/2022 21:54    Procedures Procedures    Medications Ordered in ED Medications  ondansetron (ZOFRAN-ODT) disintegrating tablet 4 mg (4 mg Oral Given 06/03/22 2124)  ondansetron (ZOFRAN) injection 4 mg (4 mg Intravenous Given 06/04/22 0921)  sodium chloride 0.9 % bolus 1,000 mL (0 mLs Intravenous Stopped 06/04/22 1025)  pantoprazole (PROTONIX) injection  40 mg (40 mg Intravenous Given 06/04/22 0921)  potassium chloride SA (KLOR-CON M) CR tablet 40 mEq (40 mEq Oral Given 06/04/22 1029)  iohexol (OMNIPAQUE) 350 MG/ML injection 100 mL (75 mLs Intravenous Contrast Given 06/04/22 1225)    ED Course/ Medical Decision Making/ A&P                           Medical Decision Making Patient is a 46 year old male, in no pertinent past medical history, who presents to the ED secondary to nausea and vomiting for the last 3 days.  States he has not been able to keep anything down.  Has been vomiting multiple times a day.  On exam fairly unremarkable, however he does state that he has been taking large doses of NSAIDs daily for the last several months.  Initially labs are fairly unremarkable except for leukocytosis of 16+, and mild hypokalemia and elevated BUN.  Initial x-rays were unremarkable.  Patient was insistent further evaluation, so CT abdomen pelvis was obtained.  There is no abdominal pain, no tenderness to palpation.  No blood in stool or vomitus.  Amount and/or Complexity of Data Reviewed Labs: ordered.    Details: Mild hypokalemia of 3.1, which potassium was given.  Additionally leukocytosis of 16.2, likely secondary  from vomiting. Radiology: ordered.    Details: X-rays unremarkable.  CT abdomen pelvis obtained secondary to patient's insistence for further evaluation, CT abdomen pelvis unremarkable.  No explanation for vomiting. Discussion of management or test interpretation with external provider(s): Patient is a 46 year old male here for nausea, and vomiting.  Work-up was fairly unremarkable except for leukocytosis of 16.2, likely reactive from vomiting.  His symptoms are likely from PUD, discharged with Zofran and PPI.  Instructed follow-up with PCP.  Given information on PUD triggers.  Advised to stop taking ibuprofen.  Return precautions emphasized.  Risk Prescription drug management.   Of note: pt was able to PO challenge successfully prior to DC  Final Clinical Impression(s) / ED Diagnoses Final diagnoses:  Nausea and vomiting, unspecified vomiting type    Rx / DC Orders ED Discharge Orders          Ordered    pantoprazole (PROTONIX) 40 MG tablet  Daily        06/04/22 1245    ondansetron (ZOFRAN) 4 MG tablet  Every 6 hours        06/04/22 1245              Vasili Fok Carlean Jews, PA 06/04/22 1255    Zadyn Yardley, [place], [place] 06/04/22 1410    Valarie Merino, MD 06/05/22 [phone]

## 2022-06-04 NOTE — Discharge Instructions (Addendum)
Please follow-up with your primary care doctor.  Take your pantoprazole daily, and use the Zofran as needed.  If you start vomiting bright red blood, black blood, or have black or red stools please return to the ER.

## 2022-06-04 NOTE — Discharge Planning (Signed)
RNCM consulted regarding medication assistance of pt unable to afford medications.  Rx e-scribed to [facility] and will deliver to pt prior to discharge home.  No further RNCM needs identified at this time.

## 2022-06-04 NOTE — ED Notes (Signed)
Pt provided with crackers.

## 2022-06-05 LAB — URINE CULTURE: Culture: NO GROWTH

## 2022-06-15 ENCOUNTER — Inpatient Hospital Stay: Payer: Self-pay | Admitting: Nurse Practitioner

## 2022-07-27 NOTE — Progress Notes (Unsigned)
Patient ID: Donald Rhodes, male    DOB: May 09, 1976  MRN: [id number]  CC: Follow-Up  Subjective: Donald Rhodes is a 46 y.o. male who presents for follow-up.   His concerns today include:  Bilateral knee pain. Right > left. Right knee pain persisting and worsening. Sometimes unable to bear full weight on right lower extremity due to knee pain. Using cane to assist with walking. Last fall 2 days ago and did not seek medical evaluation at that time. Taking Ibuprofen and not helping. Reports history of peptic ulcer disease and was treated at the local emergency department months ago due to nausea/vomiting caused by NSAID's. Today he is requesting Meloxicam refill. Endorses Meloxicam helps more than Ibuprofen. I discussed with patient in detail advisement against taking Meloxicam as this can cause peptic ulcers to flare. Patient verbalized understanding. States he is in the process of getting approved for Medicaid. Doesn't suspect Medicaid will be approved until April 2024 and at that time plans to establish with an orthopedist. States he feels that if he gets his "knee drained" he will feel better.   There are no problems to display for this patient.    Current Outpatient Medications on File Prior to Visit  Medication Sig Dispense Refill   ibuprofen (ADVIL) 200 MG tablet Take 800 mg by mouth every 6 (six) hours as needed for mild pain.     meloxicam (MOBIC) 7.5 MG tablet Take 7.5 mg by mouth daily. (Patient not taking: Reported on 06/04/2022)     ondansetron (ZOFRAN) 4 MG tablet Take 1 tablet (4 mg total) by mouth every 6 (six) hours. 12 tablet 0   ondansetron (ZOFRAN-ODT) 4 MG disintegrating tablet Take 1 tablet (4 mg total) by mouth every 8 (eight) hours as needed for nausea or vomiting. (Patient not taking: Reported on 06/04/2022) 20 tablet 0   pantoprazole (PROTONIX) 40 MG tablet Take 1 tablet (40 mg total) by mouth daily. 30 tablet 0   No current facility-administered medications on file  prior to visit.    No Known Allergies  Social History   Socioeconomic History   Marital status: Single    Spouse name: Not on file   Number of children: Not on file   Years of education: Not on file   Highest education level: Not on file  Occupational History   Not on file  Tobacco Use   Smoking status: Every Day    Packs/day: 0.50    Types: Cigars, Cigarettes    Passive exposure: Current   Smokeless tobacco: Never  Vaping Use   Vaping Use: Never used  Substance and Sexual Activity   Alcohol use: No   Drug use: No   Sexual activity: Not on file  Other Topics Concern   Not on file  Social History Narrative   Not on file   Social Determinants of Health   Financial Resource Strain: Not on file  Food Insecurity: Not on file  Transportation Needs: Not on file  Physical Activity: Not on file  Stress: Not on file  Social Connections: Not on file  Intimate Partner Violence: Not on file    No family history on file.  No past surgical history on file.  ROS: Review of Systems Negative except as stated above  PHYSICAL EXAM: BP 115/79 (BP Location: Left Arm, Patient Position: Sitting, Cuff Size: Normal)   Pulse 78   Temp 98.3 F (36.8 C)   Resp 16   Ht 5' 5.98" (1.676 m)  Wt 128 lb (58.1 kg)   SpO2 98%   BMI 20.67 kg/m   Physical Exam HENT:     Head: Normocephalic and atraumatic.  Eyes:     Extraocular Movements: Extraocular movements intact.     Conjunctiva/sclera: Conjunctivae normal.     Pupils: Pupils are equal, round, and reactive to light.  Cardiovascular:     Rate and Rhythm: Normal rate and regular rhythm.     Pulses: Normal pulses.     Heart sounds: Normal heart sounds.  Pulmonary:     Effort: Pulmonary effort is normal.     Breath sounds: Normal breath sounds.  Musculoskeletal:     Cervical back: Normal range of motion and neck supple.     Right knee: Tenderness present.     Left knee: Normal.     Right lower leg: Normal.     Left lower  leg: Normal.     Right ankle: Normal.     Left ankle: Normal.  Neurological:     General: No focal deficit present.     Mental Status: He is alert and oriented to person, place, and time.  Psychiatric:        Mood and Affect: Mood normal.        Behavior: Behavior normal.     ASSESSMENT AND PLAN: 1. Chronic pain of right knee - Prednisone and Acetaminophen as prescribed. Counseled on medication adherence.  - Diagnostic xray right knee for further evaluation.  - Follow-up with primary provider as scheduled.  - predniSONE (DELTASONE) 10 MG tablet; Take 6 tablets (60 mg total) by mouth daily with breakfast for 1 day, THEN 5 tablets (50 mg total) daily with breakfast for 1 day, THEN 4 tablets (40 mg total) daily with breakfast for 1 day, THEN 3 tablets (30 mg total) daily with breakfast for 1 day, THEN 2 tablets (20 mg total) daily with breakfast for 1 day, THEN 1 tablet (10 mg total) daily with breakfast for 1 day.  Dispense: 21 tablet; Refill: 0 - acetaminophen (TYLENOL) 500 MG tablet; Take 1 tablet (500 mg total) by mouth every 6 (six) hours as needed.  Dispense: 50 tablet; Refill: 2 - DG Knee Complete 4 Views Right; Future   Patient was given the opportunity to ask questions.  Patient verbalized understanding of the plan and was able to repeat key elements of the plan. Patient was given clear instructions to go to Emergency Department or return to medical center if symptoms don't improve, worsen, or new problems develop.The patient verbalized understanding.   Orders Placed This Encounter  Procedures   DG Knee Complete 4 Views Right     Requested Prescriptions   Signed Prescriptions Disp Refills   predniSONE (DELTASONE) 10 MG tablet 21 tablet 0    Sig: Take 6 tablets (60 mg total) by mouth daily with breakfast for 1 day, THEN 5 tablets (50 mg total) daily with breakfast for 1 day, THEN 4 tablets (40 mg total) daily with breakfast for 1 day, THEN 3 tablets (30 mg total) daily with  breakfast for 1 day, THEN 2 tablets (20 mg total) daily with breakfast for 1 day, THEN 1 tablet (10 mg total) daily with breakfast for 1 day.   acetaminophen (TYLENOL) 500 MG tablet 50 tablet 2    Sig: Take 1 tablet (500 mg total) by mouth every 6 (six) hours as needed.    Follow-up with primary provider as scheduled.   [name], NP

## 2022-07-31 ENCOUNTER — Ambulatory Visit (INDEPENDENT_AMBULATORY_CARE_PROVIDER_SITE_OTHER): Payer: Self-pay | Admitting: Family

## 2022-07-31 ENCOUNTER — Encounter: Payer: Self-pay | Admitting: Family

## 2022-07-31 ENCOUNTER — Other Ambulatory Visit: Payer: Self-pay

## 2022-07-31 ENCOUNTER — Ambulatory Visit (INDEPENDENT_AMBULATORY_CARE_PROVIDER_SITE_OTHER): Payer: Self-pay

## 2022-07-31 VITALS — BP 115/79 | HR 78 | Temp 98.3°F | Resp 16 | Ht 65.98 in | Wt 128.0 lb

## 2022-07-31 DIAGNOSIS — M25561 Pain in right knee: Secondary | ICD-10-CM

## 2022-07-31 DIAGNOSIS — G8929 Other chronic pain: Secondary | ICD-10-CM

## 2022-07-31 MED ORDER — PREDNISONE 10 MG PO TABS
ORAL_TABLET | ORAL | 0 refills | Status: AC
  Filled 2022-07-31: qty 21, 6d supply, fill #0

## 2022-07-31 MED ORDER — ACETAMINOPHEN 500 MG PO TABS
500.0000 mg | ORAL_TABLET | Freq: Four times a day (QID) | ORAL | 2 refills | Status: AC | PRN
  Filled 2022-07-31: qty 50, 13d supply, fill #0

## 2022-07-31 NOTE — Progress Notes (Signed)
Pt presents for knee pain  -right knee

## 2022-08-16 NOTE — Progress Notes (Signed)
Erroneous encounter-disregard

## 2022-08-17 ENCOUNTER — Encounter: Payer: Self-pay | Admitting: Family

## 2022-08-22 ENCOUNTER — Ambulatory Visit: Payer: Self-pay | Admitting: *Deleted

## 2022-08-22 NOTE — Telephone Encounter (Signed)
Summary: both knees sore can barely walk   Pt states that he missed his appointment on 08/17/2022 and both of his knees are very sore to where he can barely walk. Next available appointment is 08/28/2022 and pt states that he is not going to be able to make it that long. Please advise     Chief Complaint: Knee pain Symptoms: States booth knees now painful, fell today, twice yesterday, day before. Seen 07/31/22, missed appt 08/17/22 for issue.  Frequency: 07/31/22 Pertinent Negatives: Patient denies fever Disposition: [] ED /[] Urgent Care (no appt availability in office) / [x] Appointment(In office/virtual)/ []  [facility]/ [] Home Care/ [] Refused Recommended Disposition /[] [facility]/ []  Follow-up with PCP Additional Notes: Secured first available 08/28/22 and placed on wait list. Advised ED for worsening, pt states "I need it drained, been to 3 EDs and they won't drain it."    Reason for Disposition  [1] SEVERE pain (e.g., excruciating, unable to walk) AND [2] not improved after 2 hours of pain medicine  Answer Assessment - Initial Assessment Questions 1. LOCATION and RADIATION: "Where is the pain located?"      Both knees 2. QUALITY: "What does the pain feel like?"  (e.g., sharp, dull, aching, burning)      3. SEVERITY: "How bad is the pain?" "What does it keep you from doing?"   (Scale 1-10; or mild, moderate, severe)   -  MILD (1-3): doesn't interfere with normal activities    -  MODERATE (4-7): interferes with normal activities (e.g., work or school) or awakens from sleep, limping    -  SEVERE (8-10): excruciating pain, unable to do any normal activities, unable to walk  8. ASSOCIATED SYMPTOMS: "Is there any swelling or redness of the knee?"     Swelling right knee 9. OTHER SYMPTOMS: "Do you have any other symptoms?" (e.g., chest pain, difficulty breathing, fever, calf pain)     States :This pain feels different." [place] today, twice yesterday due to pain.  Protocols  used: Knee Pain-A-AH

## 2022-08-22 NOTE — Telephone Encounter (Signed)
Unless a sooner appt becomes available pt would need to wait till 01/09

## 2022-08-27 NOTE — Progress Notes (Deleted)
Patient ID: Donald Rhodes, male    DOB: Jan 09, 1976  MRN: [id number]  CC: Knee Pain   Subjective: Donald Rhodes is a 47 y.o. male who presents for knee pain.   His concerns today include:  08/22/2022 per triage RN note:    Summary: both knees sore can barely walk    Pt states that he missed his appointment on 08/17/2022 and both of his knees are very sore to where he can barely walk. Next available appointment is 08/28/2022 and pt states that he is not going to be able to make it that long. Please advise      Chief Complaint: Knee pain Symptoms: States booth knees now painful, fell today, twice yesterday, day before. Seen 07/31/22, missed appt 08/17/22 for issue.  Frequency: 07/31/22 Pertinent Negatives: Patient denies fever Disposition: [] ED /[] Urgent Care (no appt availability in office) / [x] Appointment(In office/virtual)/ []  [facility]/ [] Home Care/ [] Refused Recommended Disposition /[] [facility]/ []  Follow-up with PCP Additional Notes: Secured first available 08/28/22 and placed on wait list. Advised ED for worsening, pt states "I need it drained, been to 3 EDs and they won't drain it."     Reason for Disposition  [1] SEVERE pain (e.g., excruciating, unable to walk) AND [2] not improved after 2 hours of pain medicine  Answer Assessment - Initial Assessment Questions 1. LOCATION and RADIATION: "Where is the pain located?"      Both knees 2. QUALITY: "What does the pain feel like?"  (e.g., sharp, dull, aching, burning)      3. SEVERITY: "How bad is the pain?" "What does it keep you from doing?"   (Scale 1-10; or mild, moderate, severe)   -  MILD (1-3): doesn't interfere with normal activities    -  MODERATE (4-7): interferes with normal activities (e.g., work or school) or awakens from sleep, limping    -  SEVERE (8-10): excruciating pain, unable to do any normal activities, unable to walk  8. ASSOCIATED SYMPTOMS: "Is there any swelling or redness of the  knee?"     Swelling right knee 9. OTHER SYMPTOMS: "Do you have any other symptoms?" (e.g., chest pain, difficulty breathing, fever, calf pain)     States :This pain feels different." [place] today, twice yesterday due to pain.     Today's visit 08/28/2022:  From visit 07/31/2022 Bilateral knee pain. Right > left. Right knee pain persisting and worsening. Sometimes unable to bear full weight on right lower extremity due to knee pain. Using cane to assist with walking. Last fall 2 days ago and did not seek medical evaluation at that time. Taking Ibuprofen and not helping. Reports history of peptic ulcer disease and was treated at the local emergency department months ago due to nausea/vomiting caused by NSAID's. Today he is requesting Meloxicam refill. Endorses Meloxicam helps more than Ibuprofen. I discussed with patient in detail advisement against taking Meloxicam as this can cause peptic ulcers to flare. Patient verbalized understanding. States he is in the process of getting approved for Medicaid. Doesn't suspect Medicaid will be approved until April 2024 and at that time plans to establish with an orthopedist. States he feels that if he gets his "knee drained" he will feel better.    There are no problems to display for this patient.    Current Outpatient Medications on File Prior to Visit  Medication Sig Dispense Refill   acetaminophen (TYLENOL) 500 MG tablet Take 1 tablet (500 mg total) by mouth every  6 (six) hours as needed. 50 tablet 2   ibuprofen (ADVIL) 200 MG tablet Take 800 mg by mouth every 6 (six) hours as needed for mild pain.     meloxicam (MOBIC) 7.5 MG tablet Take 7.5 mg by mouth daily. (Patient not taking: Reported on 06/04/2022)     ondansetron (ZOFRAN) 4 MG tablet Take 1 tablet (4 mg total) by mouth every 6 (six) hours. 12 tablet 0   ondansetron (ZOFRAN-ODT) 4 MG disintegrating tablet Take 1 tablet (4 mg total) by mouth every 8 (eight) hours as needed for nausea or vomiting.  (Patient not taking: Reported on 06/04/2022) 20 tablet 0   pantoprazole (PROTONIX) 40 MG tablet Take 1 tablet (40 mg total) by mouth daily. 30 tablet 0   No current facility-administered medications on file prior to visit.    No Known Allergies  Social History   Socioeconomic History   Marital status: Single    Spouse name: Not on file   Number of children: Not on file   Years of education: Not on file   Highest education level: Not on file  Occupational History   Not on file  Tobacco Use   Smoking status: Every Day    Packs/day: 0.50    Types: Cigars, Cigarettes    Passive exposure: Current   Smokeless tobacco: Never  Vaping Use   Vaping Use: Never used  Substance and Sexual Activity   Alcohol use: No   Drug use: No   Sexual activity: Not on file  Other Topics Concern   Not on file  Social History Narrative   Not on file   Social Determinants of Health   Financial Resource Strain: Not on file  Food Insecurity: Not on file  Transportation Needs: Not on file  Physical Activity: Not on file  Stress: Not on file  Social Connections: Not on file  Intimate Partner Violence: Not on file    No family history on file.  No past surgical history on file.  ROS: Review of Systems Negative except as stated above  PHYSICAL EXAM: There were no vitals taken for this visit.  Physical Exam  {male adult master:310786} {male adult master:310785}     Latest Ref Rng & Units 06/03/2022    9:35 PM 06/03/2022    7:06 AM 06/01/2022   10:10 AM  CMP  Glucose 70 - 99 mg/dL 409  811  99   BUN 6 - 20 mg/dL 22  21  15    Creatinine 0.61 - 1.24 mg/dL  9.14  7.82   Sodium 135 - 145 mmol/L 142  143  145   Potassium 3.5 - 5.1 mmol/L 3.3  3.2  4.6   Chloride 98 - 111 mmol/L 101  105  107   CO2 22 - 32 mmol/L 27  26  24    Calcium 8.9 - 10.3 mg/dL 9.6  9.3  9.8   Total Protein 6.5 - 8.1 g/dL 6.9  7.2  6.7   Total Bilirubin 0.3 - 1.2 mg/dL 1.3  1.2  0.4   Alkaline Phos 38  - 126 U/L 75  77  97   AST 15 - 41 U/L 18  20  17    ALT 0 - 44 U/L 17  17  13     Lipid Panel  No results found for: "CHOL", "TRIG", "HDL", "CHOLHDL", "VLDL", "LDLCALC", "LDLDIRECT"  CBC    Component Value Date/Time   WBC 16.2 (H) 06/03/2022 2135   RBC 5.10 06/03/2022 2135  HGB 15.0 06/03/2022 2135   HGB 15.0 06/01/2022 1010   HCT 44.6 06/03/2022 2135   HCT 44.3 06/01/2022 1010   PLT 291 06/03/2022 2135   PLT 315 06/01/2022 1010   MCV 87.5 06/03/2022 2135   MCV 87 06/01/2022 1010   MCH 29.4 06/03/2022 2135   MCHC 33.6 06/03/2022 2135   RDW 13.5 06/03/2022 2135   RDW 13.6 06/01/2022 1010   LYMPHSABS 1.7 06/03/2022 2135   LYMPHSABS 1.3 06/01/2022 1010   MONOABS 1.0 06/03/2022 2135   EOSABS 0.0 06/03/2022 2135   EOSABS 0.0 06/01/2022 1010   BASOSABS 0.1 06/03/2022 2135   BASOSABS 0.1 06/01/2022 1010    ASSESSMENT AND PLAN:  There are no diagnoses linked to this encounter.   Patient was given the opportunity to ask questions.  Patient verbalized understanding of the plan and was able to repeat key elements of the plan. Patient was given clear instructions to go to Emergency Department or return to medical center if symptoms don't improve, worsen, or new problems develop.The patient verbalized understanding.   No orders of the defined types were placed in this encounter.    Requested Prescriptions    No prescriptions requested or ordered in this encounter    No follow-ups on file.  Camillia Herter, NP

## 2022-08-28 ENCOUNTER — Ambulatory Visit: Payer: Self-pay | Admitting: Family

## 2022-08-28 NOTE — Progress Notes (Unsigned)
Patient ID: Donald Rhodes, male    DOB: 09-Oct-1975  MRN: [id number]  CC: Knee Pain   Subjective: Donald Rhodes is a 47 y.o. male who presents for knee pain.   His concerns today include:  07/31/2022 [facility] per my note: HPI: Bilateral knee pain. Right > left. Right knee pain persisting and worsening. Sometimes unable to bear full weight on right lower extremity due to knee pain. Using cane to assist with walking. Last fall 2 days ago and did not seek medical evaluation at that time. Taking Ibuprofen and not helping. Reports history of peptic ulcer disease and was treated at the local emergency department months ago due to nausea/vomiting caused by NSAID's. Today he is requesting Meloxicam refill. Endorses Meloxicam helps more than Ibuprofen. I discussed with patient in detail advisement against taking Meloxicam as this can cause peptic ulcers to flare. Patient verbalized understanding. States he is in the process of getting approved for Medicaid. Doesn't suspect Medicaid will be approved until April 2024 and at that time plans to establish with an orthopedist. States he feels that if he gets his "knee drained" he will feel better.    Assessment & Plan: - Prednisone and Acetaminophen as prescribed. Counseled on medication adherence.  - Diagnostic xray right knee for further evaluation.  - Follow-up with primary provider as scheduled.   08/22/2022 per triage RN note:    Summary: both knees sore can barely walk    Pt states that he missed his appointment on 08/17/2022 and both of his knees are very sore to where he can barely walk. Next available appointment is 08/28/2022 and pt states that he is not going to be able to make it that long. Please advise      Chief Complaint: Knee pain Symptoms: States booth knees now painful, fell today, twice yesterday, day before. Seen 07/31/22, missed appt 08/17/22 for issue.  Frequency: 07/31/22 Pertinent Negatives: Patient denies  fever Disposition: [] ED /[] Urgent Care (no appt availability in office) / [x] Appointment(In office/virtual)/ []  [facility]/ [] Home Care/ [] Refused Recommended Disposition /[] [facility]/ []  Follow-up with PCP Additional Notes: Secured first available 08/28/22 and placed on wait list. Advised ED for worsening, pt states "I need it drained, been to 3 EDs and they won't drain it."     Reason for Disposition  [1] SEVERE pain (e.g., excruciating, unable to walk) AND [2] not improved after 2 hours of pain medicine  Answer Assessment - Initial Assessment Questions 1. LOCATION and RADIATION: "Where is the pain located?"      Both knees 2. QUALITY: "What does the pain feel like?"  (e.g., sharp, dull, aching, burning)      3. SEVERITY: "How bad is the pain?" "What does it keep you from doing?"   (Scale 1-10; or mild, moderate, severe)   -  MILD (1-3): doesn't interfere with normal activities    -  MODERATE (4-7): interferes with normal activities (e.g., work or school) or awakens from sleep, limping    -  SEVERE (8-10): excruciating pain, unable to do any normal activities, unable to walk  8. ASSOCIATED SYMPTOMS: "Is there any swelling or redness of the knee?"     Swelling right knee 9. OTHER SYMPTOMS: "Do you have any other symptoms?" (e.g., chest pain, difficulty breathing, fever, calf pain)     States :This pain feels different." Donald Rhodes today, twice yesterday due to pain.     Today's visit 08/28/2022: Bilateral knee pain persisting. Difficulty  ambulating. Using  cane for assistance. Taking over-the-counter Ibuprofen with mild relief.  Reports he is still in the process for getting approved for Medicaid. Thinks will be March 2024 before he is approved. Reports he is homeless since 2017 and currently has limited amount of food stamps.   There are no problems to display for this patient.    Current Outpatient Medications on File Prior to Visit  Medication Sig Dispense Refill    acetaminophen (TYLENOL) 500 MG tablet Take 1 tablet (500 mg total) by mouth every 6 (six) hours as needed. 50 tablet 2   ibuprofen (ADVIL) 200 MG tablet Take 800 mg by mouth every 6 (six) hours as needed for mild pain.     meloxicam (MOBIC) 7.5 MG tablet Take 7.5 mg by mouth daily. (Patient not taking: Reported on 06/04/2022)     ondansetron (ZOFRAN) 4 MG tablet Take 1 tablet (4 mg total) by mouth every 6 (six) hours. 12 tablet 0   ondansetron (ZOFRAN-ODT) 4 MG disintegrating tablet Take 1 tablet (4 mg total) by mouth every 8 (eight) hours as needed for nausea or vomiting. (Patient not taking: Reported on 06/04/2022) 20 tablet 0   pantoprazole (PROTONIX) 40 MG tablet Take 1 tablet (40 mg total) by mouth daily. 30 tablet 0   No current facility-administered medications on file prior to visit.    No Known Allergies  Social History   Socioeconomic History   Marital status: Single    Spouse name: Not on file   Number of children: Not on file   Years of education: Not on file   Highest education level: Not on file  Occupational History   Not on file  Tobacco Use   Smoking status: Every Day    Packs/day: 0.50    Types: Cigars, Cigarettes    Passive exposure: Current   Smokeless tobacco: Never  Vaping Use   Vaping Use: Never used  Substance and Sexual Activity   Alcohol use: No   Drug use: No   Sexual activity: Not on file  Other Topics Concern   Not on file  Social History Narrative   Not on file   Social Determinants of Health   Financial Resource Strain: Not on file  Food Insecurity: Not on file  Transportation Needs: Not on file  Physical Activity: Not on file  Stress: Not on file  Social Connections: Not on file  Intimate Partner Violence: Not on file    No family history on file.  No past surgical history on file.  ROS: Review of Systems Negative except as stated above  PHYSICAL EXAM: BP 104/72 (BP Location: Left Arm, Patient Position: Sitting, Cuff Size:  Normal)   Pulse 84   Temp 98.3 F (36.8 C)   Resp 16   Ht 5' 5.98" (1.676 m)   Wt 127 lb (57.6 kg)   SpO2 96%   BMI 20.51 kg/m   Physical Exam HENT:     Head: Normocephalic and atraumatic.  Eyes:     Extraocular Movements: Extraocular movements intact.     Conjunctiva/sclera: Conjunctivae normal.     Pupils: Pupils are equal, round, and reactive to light.  Cardiovascular:     Rate and Rhythm: Normal rate and regular rhythm.     Pulses: Normal pulses.     Heart sounds: Normal heart sounds.  Pulmonary:     Effort: Pulmonary effort is normal.     Breath sounds: Normal breath sounds.  Musculoskeletal:     Cervical back:  Normal range of motion and neck supple.     Right knee: Decreased range of motion. Tenderness present.     Left knee: Tenderness present.     Right lower leg: Normal.     Left lower leg: Normal.     Right ankle: Normal.     Left ankle: Normal.     Right foot: Normal.     Left foot: Normal.  Skin:    General: Skin is warm and dry.  Neurological:     General: No focal deficit present.     Mental Status: He is alert and oriented to person, place, and time.  Psychiatric:        Mood and Affect: Mood normal. Affect is tearful.        Behavior: Behavior normal.     ASSESSMENT AND PLAN: 1. Chronic pain of both knees - Cyclobenzaprine as prescribed. Counseled on medication adherence/adverse effects.  - Message sent to referral coordinator, Cheryll Dessert, to see if there are any Orthopedics offices that will see uninsured patients.  - cyclobenzaprine (FLEXERIL) 5 MG tablet; Take 1 tablet (5 mg total) by mouth 3 (three) times daily as needed for muscle spasms.  Dispense: 30 tablet; Refill: 1  2. Homeless single person 3. Food insecurity - Referral to case manager Robyne Peers, RN for community resources.   Patient was given the opportunity to ask questions.  Patient verbalized understanding of the plan and was able to repeat key elements of the plan. Patient was  given clear instructions to go to Emergency Department or return to medical center if symptoms don't improve, worsen, or new problems develop.The patient verbalized understanding.    Requested Prescriptions   Signed Prescriptions Disp Refills   cyclobenzaprine (FLEXERIL) 5 MG tablet 30 tablet 1    Sig: Take 1 tablet (5 mg total) by mouth 3 (three) times daily as needed for muscle spasms.    Follow-up with primary provider as scheduled.   [name], NP

## 2022-08-30 ENCOUNTER — Other Ambulatory Visit: Payer: Self-pay

## 2022-08-30 ENCOUNTER — Telehealth: Payer: Self-pay | Admitting: Family

## 2022-08-30 ENCOUNTER — Ambulatory Visit (INDEPENDENT_AMBULATORY_CARE_PROVIDER_SITE_OTHER): Payer: Self-pay | Admitting: Family

## 2022-08-30 VITALS — BP 104/72 | HR 84 | Temp 98.3°F | Resp 16 | Ht 65.98 in | Wt 127.0 lb

## 2022-08-30 DIAGNOSIS — M25561 Pain in right knee: Secondary | ICD-10-CM

## 2022-08-30 DIAGNOSIS — Z5900 Homelessness unspecified: Secondary | ICD-10-CM

## 2022-08-30 DIAGNOSIS — Z5941 Food insecurity: Secondary | ICD-10-CM

## 2022-08-30 DIAGNOSIS — M25562 Pain in left knee: Secondary | ICD-10-CM

## 2022-08-30 DIAGNOSIS — G8929 Other chronic pain: Secondary | ICD-10-CM

## 2022-08-30 MED ORDER — CYCLOBENZAPRINE HCL 5 MG PO TABS
5.0000 mg | ORAL_TABLET | Freq: Three times a day (TID) | ORAL | 1 refills | Status: AC | PRN
  Filled 2022-08-30: qty 30, 10d supply, fill #0
  Filled 2022-09-21: qty 30, 10d supply, fill #1

## 2022-08-30 NOTE — Telephone Encounter (Signed)
Donald Rhodes, please make patient aware. Thank you.

## 2022-08-30 NOTE — Progress Notes (Signed)
Pt presents for bilateral knee pain -pt states it hurts to touch knees -questioned pt on what he request provider to do states he wants medicine other that Ibuprofen

## 2022-08-30 NOTE — Patient Instructions (Signed)
Chronic Knee Pain, Adult Chronic knee pain is pain in one or both knees that lasts longer than 3 months. Symptoms of chronic knee pain may include swelling, stiffness, and discomfort. Age-related wear and tear (osteoarthritis) of the knee joint is the most common cause of chronic knee pain. Other possible causes include: A long-term immune-related disease that causes inflammation of the knee (rheumatoid arthritis). This usually affects both knees. Inflammatory arthritis, such as gout or pseudogout. An injury to the knee that causes arthritis. An injury to the knee that damages the ligaments. Ligaments are strong tissues that connect bones to each other. Runner's knee or pain behind the kneecap. Treatment for chronic knee pain depends on the cause. The main treatments for chronic knee pain are physical therapy and weight loss. This condition may also be treated with medicines, injections, a knee sleeve or brace, and by using crutches. Rest, ice, pressure (compression), and elevation, also known as RICE therapy, may also be recommended. Follow these instructions at home: If you have a knee sleeve or brace:  Wear the knee sleeve or brace as told by your health care provider. Remove it only as told by your health care provider. Loosen it if your toes tingle, become numb, or turn cold and blue. Keep it clean. If the sleeve or brace is not waterproof: Do not let it get wet. Remove it if allowed by your health care provider, or cover it with a watertight covering when you take a bath or a shower. Managing pain, stiffness, and swelling     If directed, apply heat to the affected area as often as told by your health care provider. Use the heat source that your health care provider recommends, such as a moist heat pack or a heating pad. If you have a removable knee sleeve or brace, remove it as told by your health care provider. Place a towel between your skin and the heat source. Leave the heat on for  20-30 minutes. Remove the heat if your skin turns bright red. This is especially important if you are unable to feel pain, heat, or cold. You may have a greater risk of getting burned. If directed, put ice on the affected area. To do this: If you have a removable knee sleeve or brace, remove it as told by your health care provider. Put ice in a plastic bag. Place a towel between your skin and the bag. Leave the ice on for 20 minutes, 2-3 times a day. Remove the ice if your skin turns bright red. This is very important. If you cannot feel pain, heat, or cold, you have a greater risk of damage to the area. Move your toes often to reduce stiffness and swelling. Raise (elevate) the injured area above the level of your heart while you are sitting or lying down. Activity Avoid high-impact activities or exercises, such as running, jumping rope, or doing jumping jacks. Follow the exercise plan that your health care provider designed for you. Your health care provider may suggest that you: Avoid activities that make knee pain worse. This may require you to change your exercise routines, sport participation, or job duties. Wear shoes with cushioned soles. Avoid sports that require running and sudden changes in direction. Do physical therapy. Physical therapy is planned to match your needs and abilities. It may include exercises for strength, flexibility, stability, and endurance. Do exercises that increase balance and strength, such as tai chi and yoga. Do not use the injured limb to support your   body weight until your health care provider says that you can. Use crutches as told by your health care provider. Return to your normal activities as told by your health care provider. Ask your health care provider what activities are safe for you. General instructions Take over-the-counter and prescription medicines only as told by your health care provider. Lose weight if you are overweight. Losing even a  little weight can reduce knee pain. Ask your health care provider what your ideal weight is, and how to safely lose extra weight. A dietitian may be able to help you plan your meals. Do not use any products that contain nicotine or tobacco, such as cigarettes, e-cigarettes, and chewing tobacco. These can delay healing. If you need help quitting, ask your health care provider. Keep all follow-up visits. This is important. Contact a health care provider if: You have knee pain that is not getting better or gets worse. You are unable to do your physical therapy exercises due to knee pain. Get help right away if: Your knee swells and the swelling becomes worse. You cannot move your knee. You have severe knee pain. Summary Knee pain that lasts more than 3 months is considered chronic knee pain. The main treatments for chronic knee pain are physical therapy and weight loss. You may also need to take medicines, wear a knee sleeve or brace, use crutches, and apply ice or heat. Losing even a little weight can reduce knee pain. Ask your health care provider what your ideal weight is, and how to safely lose extra weight. A dietitian may be able to help you plan your meals. Follow the exercise plan that your health care provider designed for you. This information is not intended to replace advice given to you by your health care provider. Make sure you discuss any questions you have with your health care provider. Document Revised: 01/20/2020 Document Reviewed: 01/20/2020 Elsevier Patient Education  2023 [employer] Inc[employer]  

## 2022-09-05 NOTE — Telephone Encounter (Signed)
Thank you :)

## 2022-09-12 NOTE — Progress Notes (Unsigned)
Patient ID: Donald Rhodes, male    DOB: 1976-05-31  MRN: [id number]  CC: Knee Pain Follow-Up  Subjective: Donald Rhodes is a 47 y.o. male who presents for knee pain follow-up.  His concerns today include:  08/30/2022 per note: 1. Chronic pain of both knees - Cyclobenzaprine as prescribed. Counseled on medication adherence/adverse effects.  - Message sent to referral coordinator, Cheryll Dessert, to see if there are any Orthopedics offices that will see uninsured patients.  - cyclobenzaprine (FLEXERIL) 5 MG tablet; Take 1 tablet (5 mg total) by mouth 3 (three) times daily as needed for muscle spasms.  Dispense: 30 tablet; Refill: 1   2. Homeless single person 3. Food insecurity - Referral to case manager Robyne Peers, RN for community resources.   Arna Medici message  Hi  Good Afternoon  [facility]  but he will need to pay $100 for appointment     Erskine Squibb message  I spoke to the patient and he explained that he is living in a tent and has been unhoused for about 7 years.  He said he was only off the street for about 5.5 months within the 7 years.   He has no income and said he is not able to work because of his knee pain.  He stated that he was diagnosed with rheumatoid arthritis years ago and was also possibly diagnosed with Lyme Disease. There is no confirmation of these diagnoses and he said this occurred when he was living in [place].    He has applied for Medicaid and is anxious to be approved so he can seek treatment for his knees.  He has not applied for disability and will need more than a diagnosis of knee pain to qualify for disability and he understands that.  He has not worked in 10-12 years.   I explained to him that the best option for assistance with finding housing/ shelter would be to go to the [facility] to speak with a caseworker.  He said he knows where the [facility] is and will plan to go possibly next week. He then said that he likes to be by himself , he doesn't want to be in housing  where someone would be checking in on him.  He currently has no income for housing.   He does receive food stamps and did not express any concerns about the food stamps.   I asked him if he would like to speak with our clinical social worker and he said not at this time.   There are no problems to display for this patient.    Current Outpatient Medications on File Prior to Visit  Medication Sig Dispense Refill   acetaminophen (TYLENOL) 500 MG tablet Take 1 tablet (500 mg total) by mouth every 6 (six) hours as needed. 50 tablet 2   cyclobenzaprine (FLEXERIL) 5 MG tablet Take 1 tablet (5 mg total) by mouth 3 (three) times daily as needed for muscle spasms. 30 tablet 1   ibuprofen (ADVIL) 200 MG tablet Take 800 mg by mouth every 6 (six) hours as needed for mild pain.     meloxicam (MOBIC) 7.5 MG tablet Take 7.5 mg by mouth daily. (Patient not taking: Reported on 06/04/2022)     ondansetron (ZOFRAN) 4 MG tablet Take 1 tablet (4 mg total) by mouth every 6 (six) hours. 12 tablet 0   ondansetron (ZOFRAN-ODT) 4 MG disintegrating tablet Take 1 tablet (4 mg total) by mouth every 8 (eight) hours as needed for nausea  or vomiting. (Patient not taking: Reported on 06/04/2022) 20 tablet 0   pantoprazole (PROTONIX) 40 MG tablet Take 1 tablet (40 mg total) by mouth daily. 30 tablet 0   No current facility-administered medications on file prior to visit.    No Known Allergies  Social History   Socioeconomic History   Marital status: Single    Spouse name: Not on file   Number of children: Not on file   Years of education: Not on file   Highest education level: Not on file  Occupational History   Not on file  Tobacco Use   Smoking status: Every Day    Packs/day: 0.50    Types: Cigars, Cigarettes    Passive exposure: Current   Smokeless tobacco: Never  Vaping Use   Vaping Use: Never used  Substance and Sexual Activity   Alcohol use: No   Drug use: No   Sexual activity: Not on file  Other  Topics Concern   Not on file  Social History Narrative   Not on file   Social Determinants of Health   Financial Resource Strain: Not on file  Food Insecurity: Not on file  Transportation Needs: Not on file  Physical Activity: Not on file  Stress: Not on file  Social Connections: Not on file  Intimate Partner Violence: Not on file    No family history on file.  No past surgical history on file.  ROS: Review of Systems Negative except as stated above  PHYSICAL EXAM: There were no vitals taken for this visit.  Physical Exam  {male adult master:310786} {male adult master:310785}     Latest Ref Rng & Units 06/03/2022    9:35 PM 06/03/2022    7:06 AM 06/01/2022   10:10 AM  CMP  Glucose 70 - 99 mg/dL 027  253  99   BUN 6 - 20 mg/dL 22  21  15    Creatinine 0.61 - 1.24 mg/dL 6.64  4.03  4.74   Sodium 135 - 145 mmol/L 142  143  145   Potassium 3.5 - 5.1 mmol/L 3.3  3.2  4.6   Chloride 98 - 111 mmol/L 101  105  107   CO2 22 - 32 mmol/L 27  26  24    Calcium 8.9 - 10.3 mg/dL 9.6  9.3  9.8   Total Protein 6.5 - 8.1 g/dL 6.9  7.2  6.7   Total Bilirubin 0.3 - 1.2 mg/dL 1.3  1.2  0.4   Alkaline Phos 38 - 126 U/L 75  77  97   AST 15 - 41 U/L 18  20  17    ALT 0 - 44 U/L 17  17  13     Lipid Panel  No results found for: "CHOL", "TRIG", "HDL", "CHOLHDL", "VLDL", "LDLCALC", "LDLDIRECT"  CBC    Component Value Date/Time   WBC 16.2 (H) 06/03/2022 2135   RBC 5.10 06/03/2022 2135   HGB 15.0 06/03/2022 2135   HGB 15.0 06/01/2022 1010   HCT 44.6 06/03/2022 2135   HCT 44.3 06/01/2022 1010   PLT 291 06/03/2022 2135   PLT 315 06/01/2022 1010   MCV 87.5 06/03/2022 2135   MCV 87 06/01/2022 1010   MCH 29.4 06/03/2022 2135   MCHC 33.6 06/03/2022 2135   RDW 13.5 06/03/2022 2135   RDW 13.6 06/01/2022 1010   LYMPHSABS 1.7 06/03/2022 2135   LYMPHSABS 1.3 06/01/2022 1010   MONOABS 1.0 06/03/2022 2135   EOSABS 0.0 06/03/2022 2135   EOSABS 0.0  06/01/2022 1010   BASOSABS 0.1  06/03/2022 2135   BASOSABS 0.1 06/01/2022 1010    ASSESSMENT AND PLAN:  There are no diagnoses linked to this encounter.   Patient was given the opportunity to ask questions.  Patient verbalized understanding of the plan and was able to repeat key elements of the plan. Patient was given clear instructions to go to Emergency Department or return to medical center if symptoms don't improve, worsen, or new problems develop.The patient verbalized understanding.   No orders of the defined types were placed in this encounter.    Requested Prescriptions    No prescriptions requested or ordered in this encounter    No follow-ups on file.  [name], NP

## 2022-09-13 ENCOUNTER — Encounter: Payer: Self-pay | Admitting: Family

## 2022-09-21 ENCOUNTER — Other Ambulatory Visit: Payer: Self-pay

## 2022-12-07 ENCOUNTER — Ambulatory Visit
Admission: EM | Admit: 2022-12-07 | Discharge: 2022-12-07 | Disposition: A | Discharge status: Home / Self Care | Payer: Self-pay | Attending: Internal Medicine | Admitting: Internal Medicine

## 2022-12-07 ENCOUNTER — Other Ambulatory Visit: Payer: Self-pay

## 2022-12-07 DIAGNOSIS — R112 Nausea with vomiting, unspecified: Secondary | ICD-10-CM

## 2022-12-07 DIAGNOSIS — L989 Disorder of the skin and subcutaneous tissue, unspecified: Secondary | ICD-10-CM

## 2022-12-07 MED ORDER — ONDANSETRON 4 MG PO TBDP
4.0000 mg | ORAL_TABLET | Freq: Three times a day (TID) | ORAL | 0 refills | Status: DC | PRN
  Filled 2022-12-07: qty 20, 7d supply, fill #0

## 2022-12-07 MED ORDER — ONDANSETRON 4 MG PO TBDP
4.0000 mg | ORAL_TABLET | Freq: Once | ORAL | Status: AC
  Administered 2022-12-07: 4 mg via ORAL

## 2022-12-07 NOTE — ED Triage Notes (Signed)
Onset 3-days ago patient started vomiting. Pt states he is homeless and thinks he may have food poison.

## 2022-12-07 NOTE — Discharge Instructions (Signed)
I have prescribed nausea medication for you to take as needed.  Ensure adequate fluid hydration.  Eat a bland diet.  No spicy or greasy foods until symptoms resolve.  Go to the ER if any symptoms persist or worsen.

## 2022-12-07 NOTE — ED Provider Notes (Signed)
[name]    CSN: [id number] Arrival date & time: 12/07/22  7829      History   Chief Complaint Chief Complaint  Patient presents with   Vomiting    HPI Donald Rhodes is a 47 y.o. male.   Patient presents with nausea and vomiting that started 3 days ago.  Patient had a bowel movement this morning which was normal.  He denies diarrhea.  Denies blood in stool or emesis.  Patient is attributing symptoms to eating [employer] as this has happened before.  Patient reports that he had bodyaches and chills but did not take temperature with thermometer.  Patient reports that he takes ibuprofen and Aleve consistently but is not sure if this is related.  Denies any stomach pain.  Denies any known sick contacts.  Patient was able to keep water down yesterday.  Patient also reporting that he has a lesion to the medial portion of the index finger on the left side.  He states that it started off as a blisterlike lesion approximately 2 and half weeks ago.  He denies any drainage from the area.  Has full range of motion of finger.  Denies numbness or tingling.  Patient is concerned for spider bite.  Patient reports he is homeless.     Past Medical History:  Diagnosis Date   Irregular heart beat    Rheumatoid arthritis     There are no problems to display for this patient.   History reviewed. No pertinent surgical history.     Home Medications    Prior to Admission medications   Medication Sig Start Date End Date Taking? Authorizing Provider  ondansetron (ZOFRAN-ODT) 4 MG disintegrating tablet Take 1 tablet (4 mg total) by mouth every 8 (eight) hours as needed for nausea or vomiting. 12/07/22  Yes Kasmira Cacioppo, Rolly Salter E, FNP  acetaminophen (TYLENOL) 500 MG tablet Take 1 tablet (500 mg total) by mouth every 6 (six) hours as needed. 07/31/22   [name], NP  cyclobenzaprine (FLEXERIL) 5 MG tablet Take 1 tablet (5 mg total) by mouth 3 (three) times daily as needed for muscle  spasms. 08/30/22   [name], NP  ibuprofen (ADVIL) 200 MG tablet Take 800 mg by mouth every 6 (six) hours as needed for mild pain.    [provider]  meloxicam (MOBIC) 7.5 MG tablet Take 7.5 mg by mouth daily. Patient not taking: Reported on 06/04/2022    [provider]  pantoprazole (PROTONIX) 40 MG tablet Take 1 tablet (40 mg total) by mouth daily. 06/04/22   Small, Harley [place], PA    Family History History reviewed. No pertinent family history.  Social History Social History   Tobacco Use   Smoking status: Every Day    Packs/day: .5    Types: Cigars, Cigarettes    Passive exposure: Current   Smokeless tobacco: Never  Vaping Use   Vaping Use: Never used  Substance Use Topics   Alcohol use: No   Drug use: No     Allergies   Patient has no known allergies.   Review of Systems Review of Systems Per HPI  Physical Exam Triage Vital Signs ED Triage Vitals [12/07/22 0927]  Enc Vitals Group     BP 115/82     Pulse Rate 76     Resp 18     Temp 98.1 F (36.7 C)     Temp Source Oral     SpO2 95 %  Weight      Height      Head Circumference      Peak Flow      Pain Score      Pain Loc      Pain Edu?      Excl. in GC?    No data found.  Updated Vital Signs BP 115/82 (BP Location: Left Arm)   Pulse 76   Temp 98.1 F (36.7 C) (Oral)   Resp 18   SpO2 95%   Visual Acuity Right Eye Distance:   Left Eye Distance:   Bilateral Distance:    Right Eye Near:   Left Eye Near:    Bilateral Near:     Physical Exam Constitutional:      General: He is not in acute distress.    Appearance: Normal appearance. He is not toxic-appearing or diaphoretic.  HENT:     Head: Normocephalic and atraumatic.     Mouth/Throat:     Mouth: Mucous membranes are moist.     Pharynx: No posterior oropharyngeal erythema.  Eyes:     Extraocular Movements: Extraocular movements intact.     Conjunctiva/sclera: Conjunctivae normal.  Cardiovascular:     Rate  and Rhythm: Normal rate and regular rhythm.     Pulses: Normal pulses.     Heart sounds: Normal heart sounds.  Pulmonary:     Effort: Pulmonary effort is normal. No respiratory distress.     Breath sounds: Normal breath sounds.  Abdominal:     General: Bowel sounds are normal. There is no distension.     Palpations: Abdomen is soft.     Tenderness: There is no abdominal tenderness.  Skin:    Comments: Patient has scabbed over lesion present to the medial portion of the left index finger directly below the DIP joint.  Full range of motion of finger present.  No drainage noted.  No area of fluctuance.  It is approximately 1 cm.  No discoloration or swelling.  Capillary refill and pulses intact.  Neurological:     General: No focal deficit present.     Mental Status: He is alert and oriented to person, place, and time. Mental status is at baseline.  Psychiatric:        Mood and Affect: Mood normal.        Behavior: Behavior normal.        Thought Content: Thought content normal.        Judgment: Judgment normal.      UC Treatments / Results  Labs (all labs ordered are listed, but only abnormal results are displayed) Labs Reviewed - No data to display  EKG   Radiology No results found.  Procedures Procedures (including critical care time)  Medications Ordered in UC Medications  ondansetron (ZOFRAN-ODT) disintegrating tablet 4 mg (4 mg Oral Given 12/07/22 0948)    Initial Impression / Assessment and Plan / UC Course  I have reviewed the triage vital signs and the nursing notes.  Pertinent labs & imaging results that were available during my care of the patient were reviewed by me and considered in my medical decision making (see chart for details).     1.  Nausea and vomiting  Differential diagnoses include viral illness versus food related illness.  There are no signs of dehydration or acute abdomen on exam.  Will treat with ondansetron.  Discussed with patient the risk  of taking NSAIDs consistently.  Patient is not having any blood in stool or emesis  so do not think that emergent evaluation is necessary and there is low concern for stomach ulcer at this time.  Suggested CBC and CMP but patient declined this given that he is homeless and does not have insurance.  Advised going to the ER if any symptoms persist or worsen.  Patient verbalized understanding and was agreeable with this.  2.  Finger lesion  Suspect healing blister versus insect bite versus callus.  There are no signs of infection or abscess on exam.  It appears to be healing well and I do not think that nausea and vomiting is related to this.  Advised patient to monitor very closely for any worsening symptoms and to follow-up if they occur.  Patient verbalized understanding and was agreeable with plan. Final Clinical Impressions(s) / UC Diagnoses   Final diagnoses:  Nausea and vomiting, unspecified vomiting type  Finger lesion     Discharge Instructions      I have prescribed nausea medication for you to take as needed.  Ensure adequate fluid hydration.  Eat a bland diet.  No spicy or greasy foods until symptoms resolve.  Go to the ER if any symptoms persist or worsen.    ED Prescriptions     Medication Sig Dispense Auth. Provider   ondansetron (ZOFRAN-ODT) 4 MG disintegrating tablet Take 1 tablet (4 mg total) by mouth every 8 (eight) hours as needed for nausea or vomiting. 20 tablet Byron, Acie Fredrickson, [place]      PDMP not reviewed this encounter.   Gustavus Bryant, [place] 12/07/22 1002

## 2022-12-09 ENCOUNTER — Other Ambulatory Visit: Payer: Self-pay

## 2022-12-09 ENCOUNTER — Emergency Department (HOSPITAL_COMMUNITY)
Admission: EM | Admit: 2022-12-09 | Discharge: 2022-12-09 | Discharge status: Against Medical Advice | Payer: Self-pay | Attending: Emergency Medicine | Admitting: Emergency Medicine

## 2022-12-09 ENCOUNTER — Encounter (HOSPITAL_COMMUNITY): Payer: Self-pay

## 2022-12-09 DIAGNOSIS — Z5900 Homelessness unspecified: Secondary | ICD-10-CM | POA: Insufficient documentation

## 2022-12-09 DIAGNOSIS — M25561 Pain in right knee: Secondary | ICD-10-CM | POA: Insufficient documentation

## 2022-12-09 DIAGNOSIS — Z5329 Procedure and treatment not carried out because of patient's decision for other reasons: Secondary | ICD-10-CM | POA: Insufficient documentation

## 2022-12-09 DIAGNOSIS — R112 Nausea with vomiting, unspecified: Secondary | ICD-10-CM | POA: Insufficient documentation

## 2022-12-09 DIAGNOSIS — R1013 Epigastric pain: Secondary | ICD-10-CM | POA: Insufficient documentation

## 2022-12-09 LAB — COMPREHENSIVE METABOLIC PANEL
ALT: 13 U/L (ref 0–44)
AST: 17 U/L (ref 15–41)
Albumin: 4 g/dL (ref 3.5–5.0)
Alkaline Phosphatase: 77 U/L (ref 38–126)
Anion gap: 12 (ref 5–15)
BUN: 18 mg/dL (ref 6–20)
CO2: 28 mmol/L (ref 22–32)
Calcium: 9.3 mg/dL (ref 8.9–10.3)
Chloride: 97 mmol/L — ABNORMAL LOW (ref 98–111)
Creatinine, Ser: 0.87 mg/dL (ref 0.61–1.24)
GFR, Estimated: >60 mL/min (ref >60)
Glucose, Bld: 97 mg/dL (ref 70–99)
Potassium: 3.5 mmol/L (ref 3.5–5.1)
Sodium: 137 mmol/L (ref 135–145)
Total Bilirubin: 0.9 mg/dL (ref 0.3–1.2)
Total Protein: 6.6 g/dL (ref 6.5–8.1)

## 2022-12-09 LAB — CBC
HCT: 45.1 % (ref 39.0–52.0)
Hemoglobin: 15.5 g/dL (ref 13.0–17.0)
MCH: 29.4 pg (ref 26.0–34.0)
MCHC: 34.4 g/dL (ref 30.0–36.0)
MCV: 85.6 fL (ref 80.0–100.0)
Platelets: 299 10*3/uL (ref 150–400)
RBC: 5.27 MIL/uL (ref 4.22–5.81)
RDW: 13.6 % (ref 11.5–15.5)
WBC: 9.1 10*3/uL (ref 4.0–10.5)
nRBC: 0 % (ref 0.0–0.2)

## 2022-12-09 LAB — LIPASE, BLOOD: Lipase: 38 U/L (ref 11–51)

## 2022-12-09 MED ORDER — LACTATED RINGERS IV BOLUS: 1000.0000 mL | Freq: Once | INTRAVENOUS | Status: DC

## 2022-12-09 MED ORDER — ALUM & MAG HYDROXIDE-SIMETH 200-200-20 MG/5ML PO SUSP: 30.0000 mL | Freq: Once | ORAL | Status: DC

## 2022-12-09 MED ORDER — PANTOPRAZOLE SODIUM 40 MG IV SOLR: 40.0000 mg | Freq: Once | INTRAVENOUS | Status: DC

## 2022-12-09 MED ORDER — ONDANSETRON 4 MG PO TBDP
4.0000 mg | ORAL_TABLET | Freq: Once | ORAL | Status: AC | PRN
  Administered 2022-12-09: 4 mg via ORAL

## 2022-12-09 NOTE — ED Provider Notes (Signed)
[place] EMERGENCY DEPARTMENT AT [facility] Provider Note   CSN: [id number] Arrival date & time: 12/09/22  1059     History  No chief complaint on file.   Donald Rhodes is a 47 y.o. male.  47 year old male presents today for evaluation of nausea and vomiting.  Also endorses epigastric abdominal pain.  States in the past he has been told he has a gastric ulcer.  He endorses NSAID use however states he is cutting back.  He states he took ibuprofen due to his right knee pain which is chronic.  Denies any melanotic stools, bright red blood per rectum, hematemesis.  Does not take anything for his gastric ulcer such as PPI.  States his main concern today is the dehydration from all the vomiting.  He is requesting some fluids.  The history is provided by the patient. No language interpreter was used.       Home Medications Prior to Admission medications   Medication Sig Start Date End Date Taking? Authorizing Provider  acetaminophen (TYLENOL) 500 MG tablet Take 1 tablet (500 mg total) by mouth every 6 (six) hours as needed. 07/31/22   [name], NP  cyclobenzaprine (FLEXERIL) 5 MG tablet Take 1 tablet (5 mg total) by mouth 3 (three) times daily as needed for muscle spasms. 08/30/22   [name], NP  ibuprofen (ADVIL) 200 MG tablet Take 800 mg by mouth every 6 (six) hours as needed for mild pain.    [provider]  meloxicam (MOBIC) 7.5 MG tablet Take 7.5 mg by mouth daily. Patient not taking: Reported on 06/04/2022    [provider]  ondansetron (ZOFRAN-ODT) 4 MG disintegrating tablet Take 1 tablet (4 mg total) by mouth every 8 (eight) hours as needed for nausea or vomiting. 12/07/22   Gustavus Bryant, FNP  pantoprazole (PROTONIX) 40 MG tablet Take 1 tablet (40 mg total) by mouth daily. 06/04/22   Small, Brooke L, PA      Allergies    Patient has no known allergies.    Review of Systems   Review of Systems  Constitutional:  Negative for  fever.  Respiratory:  Negative for shortness of breath.   Cardiovascular:  Negative for chest pain.  Gastrointestinal:  Positive for abdominal pain, nausea and vomiting.  Neurological:  Negative for light-headedness.  All other systems reviewed and are negative.   Physical Exam Updated Vital Signs BP 112/78 (BP Location: Left Arm)   Pulse 83   Temp 98.8 F (37.1 C)   Resp 16   SpO2 98%  Physical Exam Vitals and nursing note reviewed.  Constitutional:      General: He is not in acute distress.    Appearance: Normal appearance. He is not ill-appearing.  HENT:     Head: Normocephalic and atraumatic.     Nose: Nose normal.  Eyes:     General: No scleral icterus.    Extraocular Movements: Extraocular movements intact.     Conjunctiva/sclera: Conjunctivae normal.  Cardiovascular:     Rate and Rhythm: Normal rate and regular rhythm.     Pulses: Normal pulses.  Pulmonary:     Effort: Pulmonary effort is normal. No respiratory distress.     Breath sounds: Normal breath sounds. No wheezing or rales.  Abdominal:     General: There is no distension.     Palpations: Abdomen is soft.     Tenderness: There is no abdominal tenderness. There is no guarding.  Musculoskeletal:  General: Normal range of motion.     Cervical back: Normal range of motion.  Skin:    General: Skin is warm and dry.  Neurological:     General: No focal deficit present.     Mental Status: He is alert and oriented to person, place, and time. Mental status is at baseline.     ED Results / Procedures / Treatments   Labs (all labs ordered are listed, but only abnormal results are displayed) Labs Reviewed  COMPREHENSIVE METABOLIC PANEL - Abnormal; Notable for the following components:      Result Value   Chloride 97 (*)    All other components within normal limits  LIPASE, BLOOD  CBC  URINALYSIS, ROUTINE W REFLEX MICROSCOPIC    EKG None  Radiology No results found.  Procedures Procedures     Medications Ordered in ED Medications  lactated ringers bolus 1,000 mL (has no administration in time range)  pantoprazole (PROTONIX) injection 40 mg (has no administration in time range)  alum & mag hydroxide-simeth (MAALOX/MYLANTA) 200-200-20 MG/5ML suspension 30 mL (has no administration in time range)  ondansetron (ZOFRAN-ODT) disintegrating tablet 4 mg (4 mg Oral Given 12/09/22 1144)    ED Course/ Medical Decision Making/ A&P                             Medical Decision Making Amount and/or Complexity of Data Reviewed Labs: ordered.  Risk OTC drugs. Prescription drug management.   47 year old homeless male presented today for evaluation of nausea vomiting and epigastric abdominal pain.  Patient reported history of gastric ulcer.  Endorses recent NSAID use but not currently.  Denies melanotic stools, bright red blood per rectum, hematemesis.  He is requesting fluids.  Will provide him with PPI, Maalox, fluids and reevaluate.  He is hemodynamically stable.  Low suspicion for GI bleed leading to acute blood loss anemia at this point.  Blood work also is reassuring.  Patient.  CBC, CMP unremarkable.  Lipase within normal limit.  While patient was undergoing workup I was notified patient had eloped.   Final Clinical Impression(s) / ED Diagnoses Final diagnoses:  Epigastric abdominal pain    Rx / DC Orders ED Discharge Orders     None         [name], [name] 12/09/22 1437    Wynetta Fines, MD 12/09/22 [phone]

## 2022-12-09 NOTE — ED Triage Notes (Signed)
Pt came in via POV d/t vomiting that has been going on for 4 days, reports his throat is very raw all the way down to a burning in his upper abd. A/Ox4, rates pain 5/10 while in triage.

## 2022-12-10 ENCOUNTER — Other Ambulatory Visit: Payer: Self-pay

## 2022-12-11 ENCOUNTER — Other Ambulatory Visit: Payer: Self-pay

## 2023-05-16 ENCOUNTER — Ambulatory Visit (INDEPENDENT_AMBULATORY_CARE_PROVIDER_SITE_OTHER): Payer: No Typology Code available for payment source | Admitting: Orthopaedic Surgery

## 2023-05-16 ENCOUNTER — Other Ambulatory Visit: Payer: Self-pay

## 2023-05-16 ENCOUNTER — Telehealth: Payer: Self-pay | Admitting: Orthopaedic Surgery

## 2023-05-16 ENCOUNTER — Other Ambulatory Visit (INDEPENDENT_AMBULATORY_CARE_PROVIDER_SITE_OTHER): Payer: No Typology Code available for payment source

## 2023-05-16 DIAGNOSIS — M1711 Unilateral primary osteoarthritis, right knee: Secondary | ICD-10-CM

## 2023-05-16 NOTE — Progress Notes (Signed)
Office Visit Note   Patient: Donald Rhodes           Date of Birth: [birth year]           MRN: [id number] Visit Date: 05/16/2023              Requested by: [name], NP [address] [place],  [place] [address] PCP: [name], NP   Assessment & Plan: Visit Diagnoses:  1. Primary osteoarthritis of right knee     Plan: Donald Rhodes is a 47 year old gentleman with advanced DJD of the right knee.  Will make a referral to rheumatology for management of his rheumatoid arthritis.  In regards to the right knee he has bone-on-bone changes with erosion of the medial femoral condyle and the medial tibial plateau.  His best option is for a total knee replacement however currently he is homeless.  He would need stable permanent housing before it would be prudent to proceed with surgery.  He declined cortisone injection today.  Follow-Up Instructions: No follow-ups on file.   Orders:  Orders Placed This Encounter  Procedures   XR KNEE 3 VIEW RIGHT   Ambulatory referral to Rheumatology   No orders of the defined types were placed in this encounter.     Procedures: No procedures performed   Clinical Data: No additional findings.   Subjective: Chief Complaint  Patient presents with   Right Knee - Pain    HPI Donald Rhodes is a 47 year old gentleman here for evaluation of severe right knee pain for many years.  He is currently homeless.  He states he has diagnosis of rheumatoid arthritis but currently not seeing a rheumatologist.  He uses a cane for ambulation. Review of Systems  Constitutional: Negative.   HENT: Negative.    Eyes: Negative.   Respiratory: Negative.    Cardiovascular: Negative.   Gastrointestinal: Negative.   Endocrine: Negative.   Genitourinary: Negative.   Skin: Negative.   Allergic/Immunologic: Negative.   Neurological: Negative.   Hematological: Negative.   Psychiatric/Behavioral: Negative.    All other systems reviewed and are  negative.    Objective: Vital Signs: There were no vitals taken for this visit.  Physical Exam Vitals and nursing note reviewed.  Constitutional:      Appearance: He is well-developed.  HENT:     Head: Normocephalic and atraumatic.  Eyes:     Pupils: Pupils are equal, round, and reactive to light.  Pulmonary:     Effort: Pulmonary effort is normal.  Abdominal:     Palpations: Abdomen is soft.  Musculoskeletal:        General: Normal range of motion.     Cervical back: Neck supple.  Skin:    General: Skin is warm.  Neurological:     Mental Status: He is alert and oriented to person, place, and time.  Psychiatric:        Behavior: Behavior normal.        Thought Content: Thought content normal.        Judgment: Judgment normal.     Ortho Exam Exam of the right knee shows varus deformity.  Medial joint line tenderness.  Pain and crepitus throughout range of motion.  Collaterals and cruciates are grossly intact. Specialty Comments:  No specialty comments available.  Imaging: XR KNEE 3 VIEW RIGHT  Result Date: 05/16/2023 X-rays demonstrate severe degenerative joint disease with erosion of the medial tibial plateau and medial femoral condyle.    PMFS  History: There are no problems to display for this patient.  Past Medical History:  Diagnosis Date   Irregular heart beat    Rheumatoid arthritis (HCC)     No family history on file.  No past surgical history on file. Social History   Occupational History   Not on file  Tobacco Use   Smoking status: Every Day    Current packs/day: 0.50    Types: Cigars, Cigarettes    Passive exposure: Current   Smokeless tobacco: Never  Vaping Use   Vaping status: Never Used  Substance and Sexual Activity   Alcohol use: No   Drug use: No   Sexual activity: Not on file

## 2023-05-16 NOTE — Telephone Encounter (Signed)
Pt called stating Dr Roda Shutters. Sent a referral to see a rheumatologist and they can't see him until March. Pt is asking for referral to be sent right away to [facility] office. Please call pt at [phone].

## 2023-05-17 NOTE — Telephone Encounter (Signed)
Referral faxed to [facility] they will contact pt to schedule

## 2023-05-29 ENCOUNTER — Telehealth: Payer: Self-pay | Admitting: Orthopaedic Surgery

## 2023-05-29 NOTE — Telephone Encounter (Signed)
Pt called stating that Dr Roda Shutters sent referral to [facility]. They do not accept his insurance. Pt asked for referral be sent to Dr Edger House on [facility]. Pt phone number is [phone].

## 2023-05-29 NOTE — Telephone Encounter (Signed)
Referral faxed to Dr Edger House to [facility]. Pending appt

## 2023-06-20 ENCOUNTER — Ambulatory Visit
Admission: EM | Admit: 2023-06-20 | Discharge: 2023-06-20 | Disposition: A | Discharge status: Home / Self Care | Payer: No Typology Code available for payment source | Attending: Physician Assistant | Admitting: Physician Assistant

## 2023-06-20 DIAGNOSIS — R058 Other specified cough: Secondary | ICD-10-CM | POA: Diagnosis not present

## 2023-06-20 IMAGING — CR DG HAND COMPLETE 3+V*R*
3 series · 3 of 3 positions shown · non-contrast
Comparison: None.

CLINICAL DATA: Swelling, possible spider bite on [REDACTED] along
lateral side of distal second finger

EXAM:
RIGHT HAND - COMPLETE 3+ VIEW

[x hand pa right]
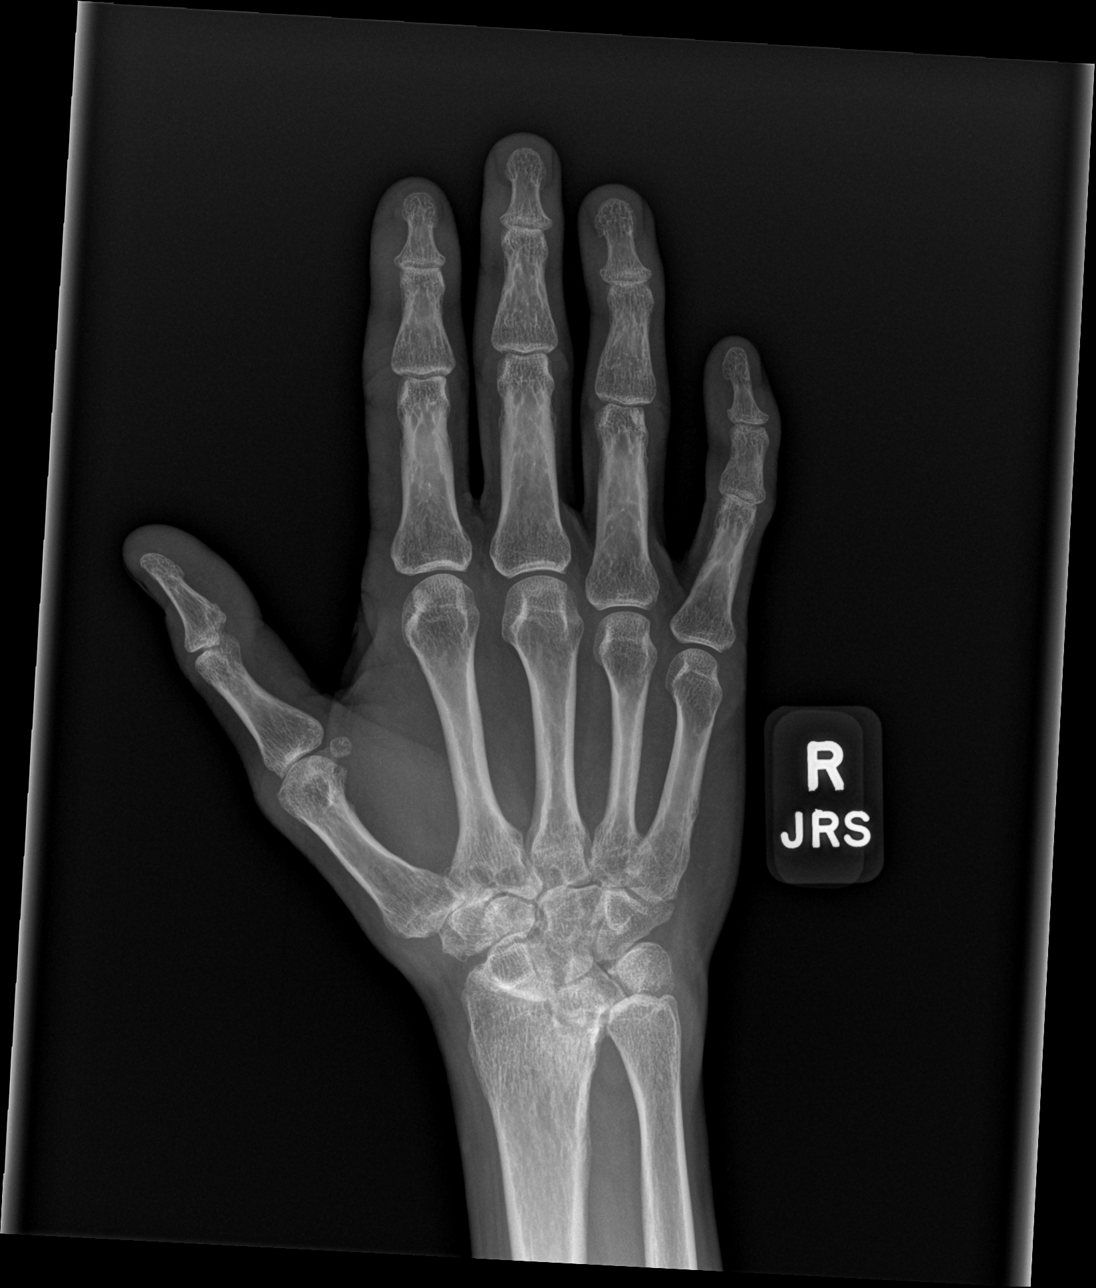

[x hand obl right]
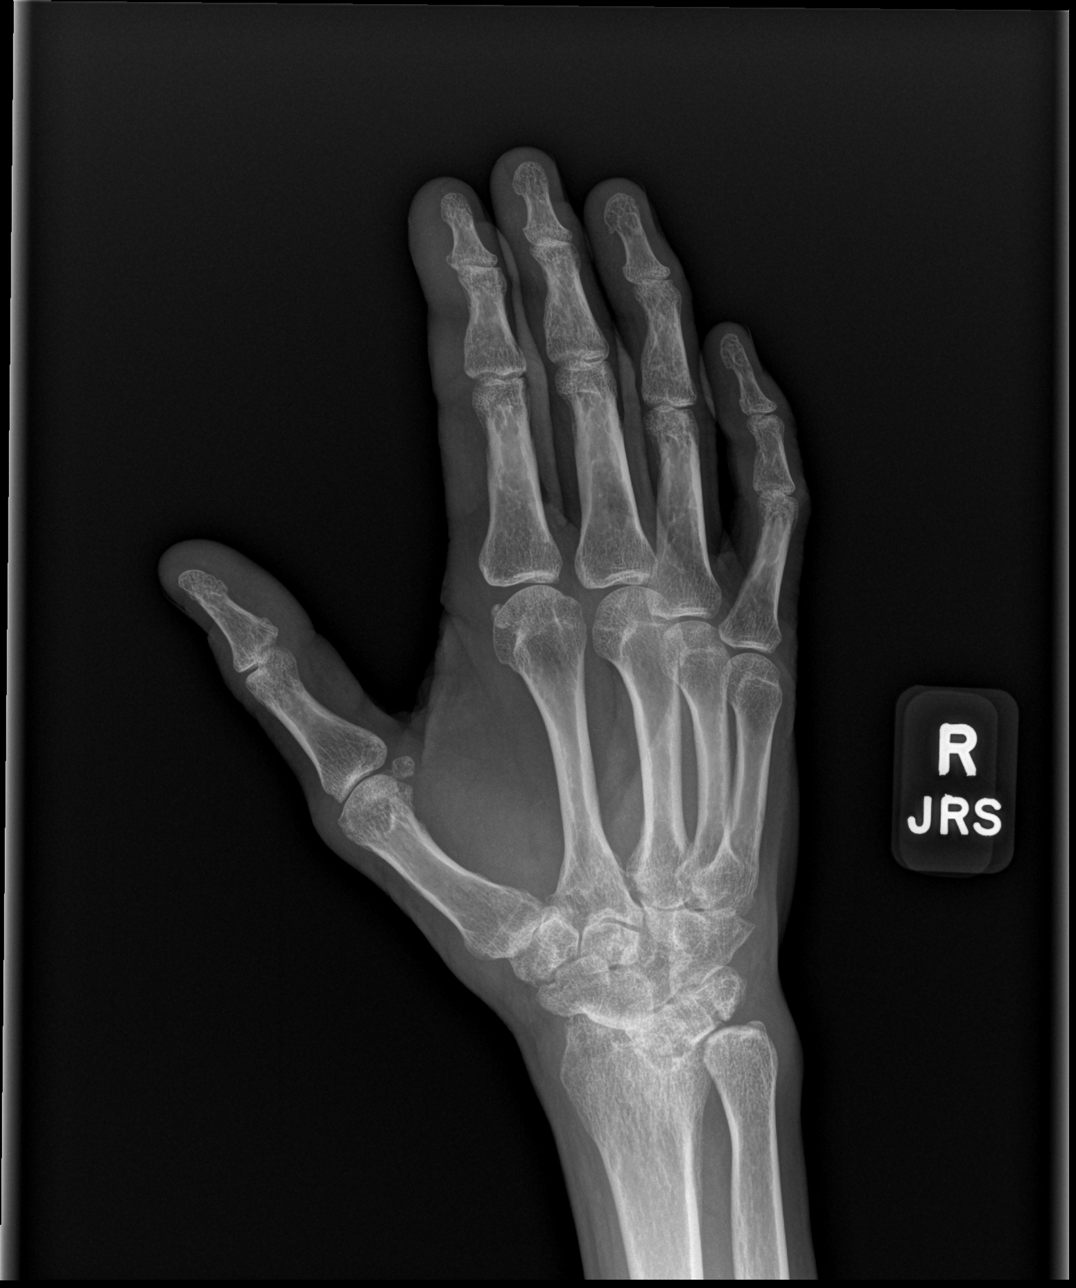

[x hand lat right]
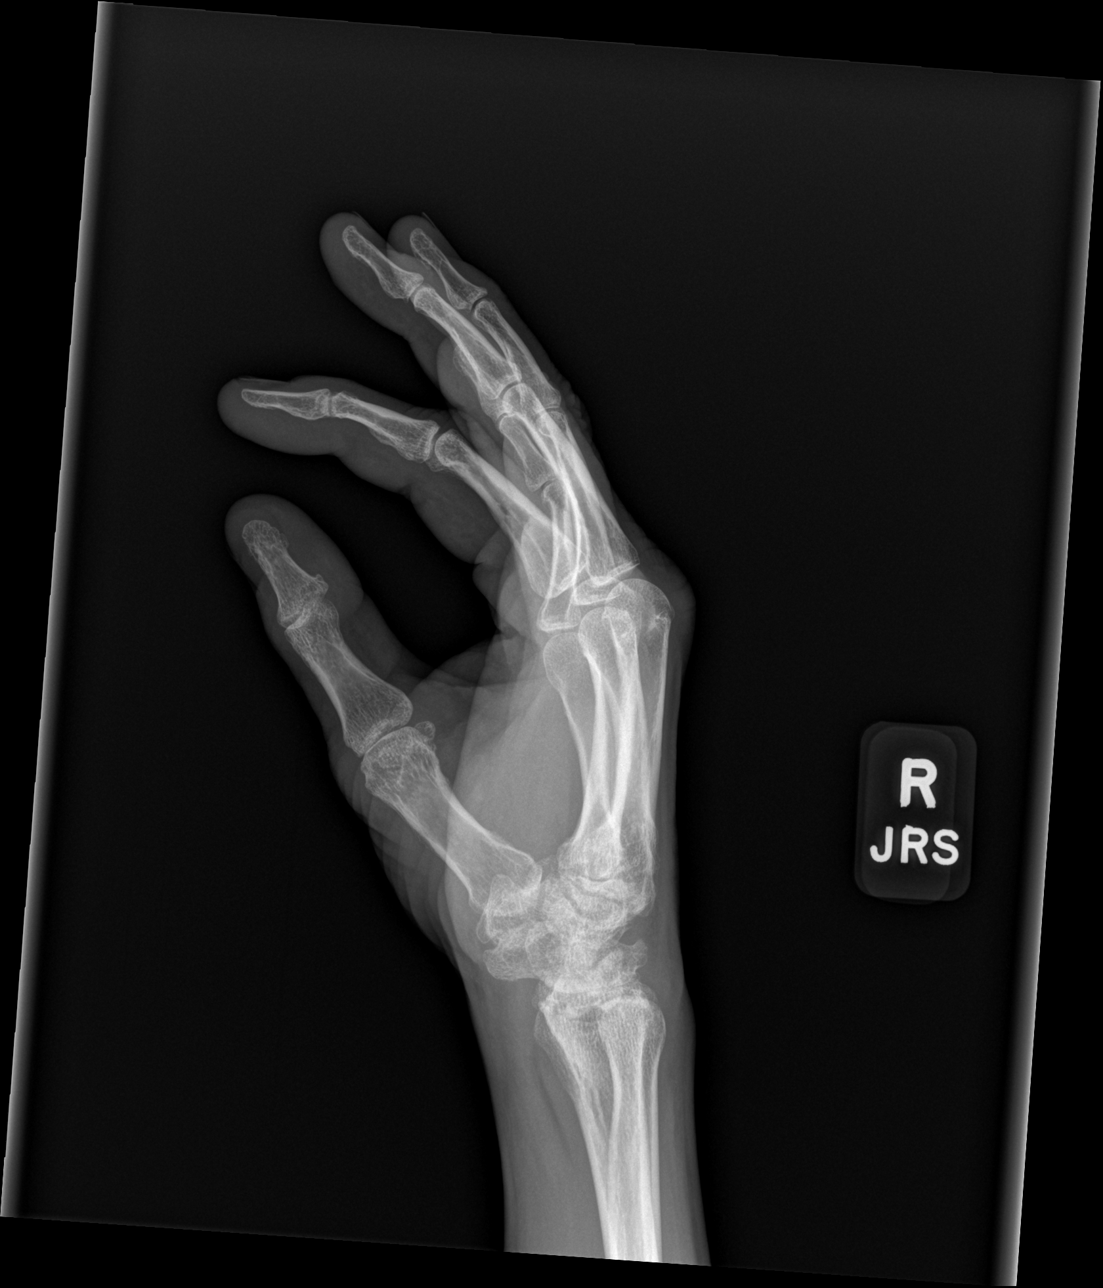

[3 of 3 positions shown; findings below may reference images not displayed]

FINDINGS: There is no acute fracture or dislocation. Bony alignment is normal.
There is marked radiocarpal joint space narrowing. There is moderate
thumb and index finger CMC joint space narrowing.

There is mild soft tissue swelling along the proximal index finger.
There is no radiopaque foreign body.
IMPRESSION: 1. Mild soft tissue swelling along the proximal index finger with no
underlying osseous abnormality. No radiopaque foreign body.
2. Marked radiocarpal joint space narrowing and moderate thumb and
index finger CMC joint space narrowing likely reflecting sequelae of
rheumatoid arthritis.

## 2023-06-20 MED ORDER — AEROCHAMBER PLUS FLO-VU MEDIUM MISC
1.0000 | Freq: Once | Status: AC
  Administered 2023-06-20: 1

## 2023-06-20 MED ORDER — ALBUTEROL SULFATE HFA 108 (90 BASE) MCG/ACT IN AERS
2.0000 | INHALATION_SPRAY | RESPIRATORY_TRACT | Status: DC
  Administered 2023-06-20: 2 via RESPIRATORY_TRACT

## 2023-06-20 MED ORDER — AZITHROMYCIN 250 MG PO TABS: 250.0000 mg | ORAL_TABLET | Freq: Every day | ORAL | 0 refills | Status: AC

## 2023-06-20 NOTE — ED Provider Notes (Signed)
[name]    CSN: [id number] Arrival date & time: 06/20/23  8295      History   Chief Complaint Chief Complaint  Patient presents with   Cough    HPI Donald Rhodes is a 47 y.o. male.   Patient here today for evaluation of cough, congestion, wheezing he has had for the last several days.  He denies any fever.  He has not any vomiting or diarrhea.  He does not report treatment for symptoms.  The history is provided by the patient.    Past Medical History:  Diagnosis Date   Irregular heart beat    Rheumatoid arthritis (HCC)     There are no problems to display for this patient.   History reviewed. No pertinent surgical history.     Home Medications    Prior to Admission medications   Medication Sig Start Date End Date Taking? Authorizing Provider  azithromycin (ZITHROMAX) 250 MG tablet Take 1 tablet (250 mg total) by mouth daily. Take first 2 tablets together, then 1 every day until finished. 06/20/23  Yes [name], [name]  ibuprofen (ADVIL) 200 MG tablet Take 800 mg by mouth every 6 (six) hours as needed for mild pain.   Yes [provider]  acetaminophen (TYLENOL) 500 MG tablet Take 1 tablet (500 mg total) by mouth every 6 (six) hours as needed. 07/31/22   [name], NP  cyclobenzaprine (FLEXERIL) 5 MG tablet Take 1 tablet (5 mg total) by mouth 3 (three) times daily as needed for muscle spasms. 08/30/22   [name], NP  meloxicam (MOBIC) 7.5 MG tablet Take 7.5 mg by mouth daily. Patient not taking: Reported on 06/04/2022    [provider]  ondansetron (ZOFRAN-ODT) 4 MG disintegrating tablet Take 1 tablet (4 mg total) by mouth every 8 (eight) hours as needed. 07/10/23   [name], [name], [name]  pantoprazole (PROTONIX) 40 MG tablet Take 1 tablet (40 mg total) by mouth daily. 07/10/23   [name], [name]    Family History History reviewed. No pertinent family history.  Social History Social History   Tobacco Use    Smoking status: Every Day    Current packs/day: 0.50    Types: Cigars, Cigarettes    Passive exposure: Current   Smokeless tobacco: Never  Vaping Use   Vaping status: Never Used  Substance Use Topics   Alcohol use: No   Drug use: No     Allergies   Patient has no known allergies.   Review of Systems Review of Systems  Constitutional:  Negative for chills and fever.  HENT:  Positive for congestion and sore throat. Negative for ear pain.   Eyes:  Negative for discharge and redness.  Respiratory:  Positive for cough, shortness of breath and wheezing.   Gastrointestinal:  Negative for diarrhea, nausea and vomiting.     Physical Exam Triage Vital Signs ED Triage Vitals  Encounter Vitals Group     BP      Systolic BP Percentile      Diastolic BP Percentile      Pulse      Resp      Temp      Temp src      SpO2      Weight      Height      Head Circumference      Peak Flow      Pain Score      Pain Loc  Pain Education      Exclude from Growth Chart    No data found.  Updated Vital Signs BP 121/81 (BP Location: Left Arm)   Pulse 70   Temp 97.7 F (36.5 C) (Oral)   Resp 20   SpO2 95%      Physical Exam Vitals and nursing note reviewed.  Constitutional:      General: He is not in acute distress.    Appearance: Normal appearance. He is not ill-appearing.  HENT:     Head: Normocephalic and atraumatic.     Nose: Nose normal. No congestion.     Mouth/Throat:     Mouth: Mucous membranes are moist.     Pharynx: Oropharynx is clear. No oropharyngeal exudate or posterior oropharyngeal erythema.  Eyes:     Conjunctiva/sclera: Conjunctivae normal.  Cardiovascular:     Rate and Rhythm: Normal rate and regular rhythm.     Heart sounds: Normal heart sounds. No murmur heard. Pulmonary:     Effort: Pulmonary effort is normal. No respiratory distress.     Breath sounds: Wheezing present. No rhonchi or rales.     Comments: Rare scattered wheeze Skin:     General: Skin is warm and dry.  Neurological:     Mental Status: He is alert.  Psychiatric:        Mood and Affect: Mood normal.        Thought Content: Thought content normal.      UC Treatments / Results  Labs (all labs ordered are listed, but only abnormal results are displayed) Labs Reviewed - No data to display  EKG   Radiology No results found.  Procedures Procedures (including critical care time)  Medications Ordered in UC Medications  AeroChamber Plus Flo-Vu Medium MISC 1 each (1 each Other Given 06/20/23 1021)    Initial Impression / Assessment and Plan / UC Course  I have reviewed the triage vital signs and the nursing notes.  Pertinent labs & imaging results that were available during my care of the patient were reviewed by me and considered in my medical decision making (see chart for details).    Z-Pak prescribed for suspected bronchitis and AeroChamber given as well.  Recommended follow-up if no improvement or with any further concerns.  Patient expresses understanding.  Final Clinical Impressions(s) / UC Diagnoses   Final diagnoses:  Productive cough   Discharge Instructions   None    ED Prescriptions     Medication Sig Dispense Auth. Provider   azithromycin (ZITHROMAX) 250 MG tablet Take 1 tablet (250 mg total) by mouth daily. Take first 2 tablets together, then 1 every day until finished. 6 tablet [name], [name]      PDMP not reviewed this encounter.   [name], [name] 07/14/23 [id number]

## 2023-06-20 NOTE — ED Triage Notes (Signed)
Productive cough, wheezing that started Sunday. Not taking any OTC medication at this time.

## 2023-07-10 ENCOUNTER — Encounter (HOSPITAL_COMMUNITY): Payer: Self-pay

## 2023-07-10 ENCOUNTER — Emergency Department (HOSPITAL_COMMUNITY)
Admission: EM | Admit: 2023-07-10 | Discharge: 2023-07-10 | Disposition: A | Discharge status: Home / Self Care | Payer: No Typology Code available for payment source | Attending: Emergency Medicine | Admitting: Emergency Medicine

## 2023-07-10 ENCOUNTER — Other Ambulatory Visit: Payer: Self-pay

## 2023-07-10 DIAGNOSIS — K2970 Gastritis, unspecified, without bleeding: Secondary | ICD-10-CM

## 2023-07-10 DIAGNOSIS — K2971 Gastritis, unspecified, with bleeding: Secondary | ICD-10-CM | POA: Diagnosis not present

## 2023-07-10 DIAGNOSIS — R1013 Epigastric pain: Secondary | ICD-10-CM | POA: Diagnosis not present

## 2023-07-10 LAB — CBC
HCT: 40.7 % (ref 39.0–52.0)
Hemoglobin: 13.5 g/dL (ref 13.0–17.0)
MCH: 29.8 pg (ref 26.0–34.0)
MCHC: 33.2 g/dL (ref 30.0–36.0)
MCV: 89.8 fL (ref 80.0–100.0)
Platelets: 258 10*3/uL (ref 150–400)
RBC: 4.53 MIL/uL (ref 4.22–5.81)
RDW: 13.9 % (ref 11.5–15.5)
WBC: 7.1 10*3/uL (ref 4.0–10.5)
nRBC: 0 % (ref 0.0–0.2)

## 2023-07-10 LAB — COMPREHENSIVE METABOLIC PANEL
ALT: 12 U/L (ref 0–44)
AST: 15 U/L (ref 15–41)
Albumin: 4.1 g/dL (ref 3.5–5.0)
Alkaline Phosphatase: 80 U/L (ref 38–126)
Anion gap: 8 (ref 5–15)
BUN: 18 mg/dL (ref 6–20)
CO2: 27 mmol/L (ref 22–32)
Calcium: 9.5 mg/dL (ref 8.9–10.3)
Chloride: 107 mmol/L (ref 98–111)
Creatinine, Ser: 0.69 mg/dL (ref 0.61–1.24)
GFR, Estimated: >60 mL/min (ref >60)
Glucose, Bld: 134 mg/dL — ABNORMAL HIGH (ref 70–99)
Potassium: 3.7 mmol/L (ref 3.5–5.1)
Sodium: 142 mmol/L (ref 135–145)
Total Bilirubin: 0.5 mg/dL (ref <1.2)
Total Protein: 6.5 g/dL (ref 6.5–8.1)

## 2023-07-10 LAB — URINALYSIS, ROUTINE W REFLEX MICROSCOPIC
Bacteria, UA: NONE SEEN
Bilirubin Urine: NEGATIVE
Glucose, UA: NEGATIVE mg/dL
Hgb urine dipstick: NEGATIVE
Ketones, ur: NEGATIVE mg/dL
Leukocytes,Ua: NEGATIVE
Nitrite: NEGATIVE
Protein, ur: 100 mg/dL — AB
Specific Gravity, Urine: 1.021 (ref 1.005–1.030)
pH: 8 (ref 5.0–8.0)

## 2023-07-10 LAB — LIPASE, BLOOD: Lipase: 26 U/L (ref 11–51)

## 2023-07-10 MED ORDER — ONDANSETRON 4 MG PO TBDP: 4.0000 mg | ORAL_TABLET | Freq: Three times a day (TID) | ORAL | 0 refills | Status: DC | PRN

## 2023-07-10 MED ORDER — ONDANSETRON HCL 4 MG/2ML IJ SOLN
4.0000 mg | Freq: Once | INTRAMUSCULAR | Status: AC
  Administered 2023-07-10: 4 mg via INTRAVENOUS
  Filled 2023-07-10: qty 2

## 2023-07-10 MED ORDER — PANTOPRAZOLE SODIUM 40 MG IV SOLR
40.0000 mg | Freq: Once | INTRAVENOUS | Status: AC
  Administered 2023-07-10: 40 mg via INTRAVENOUS
  Filled 2023-07-10: qty 10

## 2023-07-10 MED ORDER — PANTOPRAZOLE SODIUM 40 MG PO TBEC: 40.0000 mg | DELAYED_RELEASE_TABLET | Freq: Every day | ORAL | 1 refills | Status: DC

## 2023-07-10 MED ORDER — SUCRALFATE 1 G PO TABS
1.0000 g | ORAL_TABLET | Freq: Once | ORAL | Status: AC
  Administered 2023-07-10: 1 g via ORAL
  Filled 2023-07-10: qty 1

## 2023-07-10 NOTE — ED Provider Notes (Signed)
[place] EMERGENCY DEPARTMENT AT [facility] Provider Note   CSN: [id number] Arrival date & time: 07/10/23  1308     History  Chief Complaint  Patient presents with   Abdominal Pain    Donald Rhodes is a 47 y.o. male.  47 year old male presents today for concern of epigastric abdominal pain.  He endorses history of gastric ulcers.  Endorses some NSAID use.  Denies hematemesis, blood in stool, or melanotic stools.  His main concern is nausea and vomiting.  Not able to keep any food or drink down.  States this feels similar to his previous episode of gastritis.  No chest pain, shortness of breath.  The history is provided by the patient. No language interpreter was used.       Home Medications Prior to Admission medications   Medication Sig Start Date End Date Taking? Authorizing Provider  acetaminophen (TYLENOL) 500 MG tablet Take 1 tablet (500 mg total) by mouth every 6 (six) hours as needed. 07/31/22   [name], NP  azithromycin (ZITHROMAX) 250 MG tablet Take 1 tablet (250 mg total) by mouth daily. Take first 2 tablets together, then 1 every day until finished. 06/20/23   [name], [name]  cyclobenzaprine (FLEXERIL) 5 MG tablet Take 1 tablet (5 mg total) by mouth 3 (three) times daily as needed for muscle spasms. 08/30/22   [name], NP  ibuprofen (ADVIL) 200 MG tablet Take 800 mg by mouth every 6 (six) hours as needed for mild pain.    [provider]  meloxicam (MOBIC) 7.5 MG tablet Take 7.5 mg by mouth daily. Patient not taking: Reported on 06/04/2022    [provider]  ondansetron (ZOFRAN-ODT) 4 MG disintegrating tablet Take 1 tablet (4 mg total) by mouth every 8 (eight) hours as needed for nausea or vomiting. 12/07/22   Gustavus Bryant, FNP  pantoprazole (PROTONIX) 40 MG tablet Take 1 tablet (40 mg total) by mouth daily. 06/04/22   Small, Brooke L, PA      Allergies    Patient has no known allergies.    Review of  Systems   Review of Systems  Constitutional:  Negative for chills and fever.  Respiratory:  Negative for shortness of breath.   Cardiovascular:  Negative for chest pain.  Gastrointestinal:  Positive for abdominal pain, nausea and vomiting. Negative for blood in stool and diarrhea.  Genitourinary:  Negative for flank pain.  Neurological:  Negative for light-headedness.  All other systems reviewed and are negative.   Physical Exam Updated Vital Signs BP 104/67   Pulse 80   Temp (!) 97.5 F (36.4 C)   Resp 18   Ht 5\' 6"  (1.676 m)   Wt 57.2 kg   SpO2 99%   BMI 20.34 kg/m  Physical Exam Vitals and nursing note reviewed.  Constitutional:      General: He is not in acute distress.    Appearance: Normal appearance. He is not ill-appearing.  HENT:     Head: Normocephalic and atraumatic.     Nose: Nose normal.  Eyes:     Conjunctiva/sclera: Conjunctivae normal.  Cardiovascular:     Rate and Rhythm: Normal rate and regular rhythm.  Pulmonary:     Effort: Pulmonary effort is normal. No respiratory distress.  Abdominal:     General: There is no distension.     Palpations: Abdomen is soft.     Tenderness: There is no abdominal tenderness. There is no right CVA  tenderness, left CVA tenderness or guarding.  Musculoskeletal:        General: No deformity. Normal range of motion.     Cervical back: Normal range of motion.  Skin:    Findings: No rash.  Neurological:     Mental Status: He is alert.     ED Results / Procedures / Treatments   Labs (all labs ordered are listed, but only abnormal results are displayed) Labs Reviewed  LIPASE, BLOOD  COMPREHENSIVE METABOLIC PANEL  CBC  URINALYSIS, ROUTINE W REFLEX MICROSCOPIC    EKG None  Radiology No results found.  Procedures Procedures    Medications Ordered in ED Medications  pantoprazole (PROTONIX) injection 40 mg (has no administration in time range)  sucralfate (CARAFATE) tablet 1 g (has no administration in time  range)  ondansetron (ZOFRAN) injection 4 mg (4 mg Intravenous Given 07/10/23 7829)    ED Course/ Medical Decision Making/ A&P                                 Medical Decision Making Amount and/or Complexity of Data Reviewed Labs: ordered.  Risk Prescription drug management.   47 year old male presents today for concern of epigastric abdominal pain.  History of gastritis and gastric ulcers.  Denies melanotic stools, hematemesis.  Hemodynamically stable.  No evidence of acute blood loss anemia.  Hemoglobin stable on CBC.  No leukocytosis.  CMP with preserved renal function and normal electrolytes.  Normal BUN.  UA without evidence of UTI.  Lipase within normal.  EKG without acute ischemic change.  Low suspicion for ACS.  Likely gastritis.  Improved after Protonix, Maalox, Zofran.  Tolerated p.o. intake.  Will discharge with Protonix and Zofran.  He will follow-up with PCP.  Discharged in stable condition.   Final Clinical Impression(s) / ED Diagnoses Final diagnoses:  Epigastric pain  Gastritis, presence of bleeding unspecified, unspecified chronicity, unspecified gastritis type    Rx / DC Orders ED Discharge Orders          Ordered    pantoprazole (PROTONIX) 40 MG tablet  Daily        07/10/23 0934    ondansetron (ZOFRAN-ODT) 4 MG disintegrating tablet  Every 8 hours PRN        07/10/23 0934              [name], [name] 07/10/23 5621    Benjiman Core, MD 07/10/23 1520

## 2023-07-10 NOTE — Discharge Instructions (Signed)
Your workup today was reassuring.  No evidence of bleeding.  Your pain improved after medications.  Have sent a prescription for Protonix and Zofran into the pharmacy.  For any concerning symptoms return to the emergency room.

## 2023-07-10 NOTE — ED Triage Notes (Signed)
Pt BIB [facility] from [facility] d/t ABD pain with Emesis since 2100.  Pt has hx of Ulcers - has been taking a lot of Ibuprofen d/t knee pain (surgery soon) but denies any blood in emesis.

## 2023-07-14 ENCOUNTER — Encounter: Payer: Self-pay | Admitting: Physician Assistant

## 2023-09-03 ENCOUNTER — Other Ambulatory Visit (HOSPITAL_COMMUNITY): Payer: Self-pay

## 2023-09-03 ENCOUNTER — Ambulatory Visit (INDEPENDENT_AMBULATORY_CARE_PROVIDER_SITE_OTHER): Payer: No Typology Code available for payment source | Admitting: Family

## 2023-09-03 ENCOUNTER — Other Ambulatory Visit: Payer: Self-pay

## 2023-09-03 ENCOUNTER — Encounter: Payer: Self-pay | Admitting: Family

## 2023-09-03 VITALS — BP 126/82 | HR 77 | Temp 98.0°F | Ht 66.0 in | Wt 123.0 lb

## 2023-09-03 DIAGNOSIS — M25562 Pain in left knee: Secondary | ICD-10-CM | POA: Diagnosis not present

## 2023-09-03 DIAGNOSIS — G8929 Other chronic pain: Secondary | ICD-10-CM | POA: Diagnosis not present

## 2023-09-03 DIAGNOSIS — M1710 Unilateral primary osteoarthritis, unspecified knee: Secondary | ICD-10-CM

## 2023-09-03 DIAGNOSIS — M25561 Pain in right knee: Secondary | ICD-10-CM | POA: Diagnosis not present

## 2023-09-03 MED ORDER — PREDNISONE 10 MG PO TABS
ORAL_TABLET | ORAL | 0 refills | Status: AC
  Filled 2023-09-03 (×2): qty 21, 6d supply, fill #0

## 2023-09-03 NOTE — Progress Notes (Signed)
 States right knee pain, and states left knee started to grind.

## 2023-09-03 NOTE — Progress Notes (Signed)
 Patient ID: Donald Rhodes, male    DOB: [birth year]  MRN: [id number]  CC: Knee Pain  Subjective: Donald Rhodes is a 48 y.o. male who presents for knee pain.   His concerns today include:  - Chronic bilateral knee pain persisting. Denies recent trauma/injury and red flag symptoms. Last office visit with Orthopedics on 05/16/2023. Orthopedics recommended cortisone injection (patient declined), knee surgery, and referral to Rheumatology. Today patient states he doesn't think Cortisone helps and would like to try oral medication instead. Using cane to assist with ambulation.   There are no active problems to display for this patient.    Current Outpatient Medications on File Prior to Visit  Medication Sig Dispense Refill   acetaminophen  (TYLENOL ) 500 MG tablet Take 1 tablet (500 mg total) by mouth every 6 (six) hours as needed. (Patient not taking: Reported on 09/03/2023) 50 tablet 2   azithromycin  (ZITHROMAX ) 250 MG tablet Take 1 tablet (250 mg total) by mouth daily. Take first 2 tablets together, then 1 every day until finished. (Patient not taking: Reported on 09/03/2023) 6 tablet 0   cyclobenzaprine  (FLEXERIL ) 5 MG tablet Take 1 tablet (5 mg total) by mouth 3 (three) times daily as needed for muscle spasms. (Patient not taking: Reported on 09/03/2023) 30 tablet 1   ibuprofen  (ADVIL ) 200 MG tablet Take 800 mg by mouth every 6 (six) hours as needed for mild pain. (Patient not taking: Reported on 09/03/2023)     meloxicam  (MOBIC ) 7.5 MG tablet Take 7.5 mg by mouth daily. (Patient not taking: Reported on 06/04/2022)     ondansetron  (ZOFRAN -ODT) 4 MG disintegrating tablet Take 1 tablet (4 mg total) by mouth every 8 (eight) hours as needed. (Patient not taking: Reported on 09/03/2023) 20 tablet 0   pantoprazole  (PROTONIX ) 40 MG tablet Take 1 tablet (40 mg total) by mouth daily. (Patient not taking: Reported on 09/03/2023) 30 tablet 1   No current facility-administered medications on file prior to  visit.    No Known Allergies  Social History   Socioeconomic History   Marital status: Single    Spouse name: Not on file   Number of children: Not on file   Years of education: Not on file   Highest education level: Not on file  Occupational History   Not on file  Tobacco Use   Smoking status: Every Day    Current packs/day: 0.50    Types: Cigars, Cigarettes    Passive exposure: Current   Smokeless tobacco: Never  Vaping Use   Vaping status: Never Used  Substance and Sexual Activity   Alcohol use: No   Drug use: No   Sexual activity: Not on file  Other Topics Concern   Not on file  Social History Narrative   Not on file   Social Drivers of Health   Financial Resource Strain: Not on file  Food Insecurity: Not on file  Transportation Needs: Not on file  Physical Activity: Not on file  Stress: Not on file  Social Connections: Not on file  Intimate Partner Violence: Not on file    No family history on file.  No past surgical history on file.  ROS: Review of Systems Negative except as stated above  PHYSICAL EXAM: BP 126/82   Pulse 77   Temp 98 F (36.7 C) (Oral)   Ht 5' 6 (1.676 m)   Wt 123 lb (55.8 kg)   SpO2 95%   BMI 19.85 kg/m   Physical Exam HENT:  Head: Normocephalic and atraumatic.     Nose: Nose normal.     Mouth/Throat:     Mouth: Mucous membranes are moist.     Pharynx: Oropharynx is clear.  Eyes:     Extraocular Movements: Extraocular movements intact.     Conjunctiva/sclera: Conjunctivae normal.     Pupils: Pupils are equal, round, and reactive to light.  Cardiovascular:     Rate and Rhythm: Normal rate and regular rhythm.     Pulses: Normal pulses.     Heart sounds: Normal heart sounds.  Pulmonary:     Effort: Pulmonary effort is normal.     Breath sounds: Normal breath sounds.  Musculoskeletal:        General: Normal range of motion.     Right shoulder: Normal.     Left shoulder: Normal.     Right upper arm: Normal.      Left upper arm: Normal.     Right elbow: Normal.     Left elbow: Normal.     Right forearm: Normal.     Left forearm: Normal.     Right wrist: Normal.     Left wrist: Normal.     Right hand: Normal.     Left hand: Normal.     Cervical back: Normal, normal range of motion and neck supple.     Thoracic back: Normal.     Lumbar back: Normal.     Right hip: Normal.     Left hip: Normal.     Right upper leg: Normal.     Left upper leg: Normal.     Right knee: Tenderness present.     Left knee: Tenderness present.     Right lower leg: Normal.     Left lower leg: Normal.     Right ankle: Normal.     Left ankle: Normal.     Right foot: Normal.     Left foot: Normal.  Neurological:     General: No focal deficit present.     Mental Status: He is alert and oriented to person, place, and time.  Psychiatric:        Mood and Affect: Mood normal.        Behavior: Behavior normal.      ASSESSMENT AND PLAN: 1. Chronic pain of both knees (Primary) 2. Arthritis of knee - Prednisone  as prescribed. Counseled on medication adherence/adverse effects. - Referral to Orthopedic Surgery for evaluation/management.  - Referral to Rheumatology for evaluation/management.  - Follow-up with primary provider as scheduled.  - Ambulatory referral to Orthopedic Surgery - Ambulatory referral to Rheumatology - predniSONE  (DELTASONE ) 10 MG tablet; Take 6 tablets (60 mg total) by mouth daily with breakfast for 1 day, THEN 5 tablets (50 mg total) daily with breakfast for 1 day, THEN 4 tablets (40 mg total) daily with breakfast for 1 day, THEN 3 tablets (30 mg total) daily with breakfast for 1 day, THEN 2 tablets (20 mg total) daily with breakfast for 1 day, THEN 1 tablet (10 mg total) daily with breakfast for 1 day.  Dispense: 21 tablet; Refill: 0     Patient was given the opportunity to ask questions.  Patient verbalized understanding of the plan and was able to repeat key elements of the plan. Patient was  given clear instructions to go to Emergency Department or return to medical center if symptoms don't improve, worsen, or new problems develop.The patient verbalized understanding.   Orders Placed This Encounter  Procedures   Ambulatory  referral to Orthopedic Surgery   Ambulatory referral to Rheumatology     Requested Prescriptions   Signed Prescriptions Disp Refills   predniSONE  (DELTASONE ) 10 MG tablet 21 tablet 0    Sig: Take 6 tablets (60 mg total) by mouth daily with breakfast for 1 day, THEN 5 tablets (50 mg total) daily with breakfast for 1 day, THEN 4 tablets (40 mg total) daily with breakfast for 1 day, THEN 3 tablets (30 mg total) daily with breakfast for 1 day, THEN 2 tablets (20 mg total) daily with breakfast for 1 day, THEN 1 tablet (10 mg total) daily with breakfast for 1 day.    Follow-up with primary provider as scheduled.   Greig JINNY Drones, NP

## 2023-09-04 ENCOUNTER — Telehealth: Payer: Self-pay | Admitting: Orthopaedic Surgery

## 2023-09-04 NOTE — Telephone Encounter (Signed)
 Spoke with patient. He said that he has a friend that will provide assistance after surgery.  He also states that once given a date, he has someone that is going to pay for 4-6weeks of living in a motel. That same person needs a letter stating the date of surgery from office.  Please advise .Donald AasAaron Rhodes

## 2023-09-04 NOTE — Telephone Encounter (Signed)
 Pt called stating he would like to move forward with surgery for right knee replacement. Please call pt when ready to discuss surgery date. Pt phone number is [phone].

## 2023-09-04 NOTE — Telephone Encounter (Signed)
 According to the last note the patient was homeless.  He needs to have stable housing and assistance after surgery before we can do his surgery.

## 2023-09-04 NOTE — Telephone Encounter (Signed)
 Lets have him come back to the office to discuss everything.

## 2023-09-11 ENCOUNTER — Ambulatory Visit: Payer: No Typology Code available for payment source | Admitting: Orthopaedic Surgery

## 2023-09-12 ENCOUNTER — Encounter: Payer: Self-pay | Admitting: Orthopaedic Surgery

## 2023-09-12 ENCOUNTER — Other Ambulatory Visit (INDEPENDENT_AMBULATORY_CARE_PROVIDER_SITE_OTHER): Payer: No Typology Code available for payment source

## 2023-09-12 ENCOUNTER — Ambulatory Visit (INDEPENDENT_AMBULATORY_CARE_PROVIDER_SITE_OTHER): Payer: No Typology Code available for payment source | Admitting: Orthopaedic Surgery

## 2023-09-12 DIAGNOSIS — M1711 Unilateral primary osteoarthritis, right knee: Secondary | ICD-10-CM

## 2023-09-12 NOTE — Progress Notes (Signed)
Office Visit Note   Patient: Donald Rhodes           Date of Birth: 1976/04/05           MRN: [id number] Visit Date: 09/12/2023              Requested by: [name], NP [address] [place],  [place] [address] PCP: [name], NP   Assessment & Plan: Visit Diagnoses:  1. Primary osteoarthritis of right knee     Plan: [name] is a 48 year old gentleman with severe degenerative joint disease of the right knee.  He will not extend his knee past 40 degrees but I think this is mainly due to severe pain and guarding.  We had a long discussion about the importance of postop physical therapy and that the challenge is to prevent the flexion contracture from recurring.  We will do everything we can to get him a functional knee.  Risk benefits prognosis of a total knee replacement discussed.  Eunice Blase will call the patient to confirm surgery date once we have the necessary preoperative clearances.  Impression is severe right knee degenerative joint disease secondary to Osteoarthritis.  Bone on bone joint space narrowing is seen on radiographs with neutral alignment.  At this point, conservative treatments fail to provide any significant relief and the pain is severely affecting ADLs and quality of life.  Based on treatment options, the patient has elected to move forward with a knee replacement.  We have discussed the surgical risks that include but are not limited to infection, DVT, leg length discrepancy, stiffness, numbness, tingling, incomplete relief of pain.  Recovery and prognosis were also reviewed.    Anticoagulants: No antithrombotic Postop anticoagulation: Xarelto Diabetic: No  Nickel allergy: No Prior DVT/PE: No Tobacco use: Yes 3-5 cigarettes per day Clearances needed for surgery: Ricky Stabs Anticipated discharge dispo: Home   Follow-Up Instructions: No follow-ups on file.   Orders:  Orders Placed This Encounter  Procedures   XR KNEE 3 VIEW RIGHT   No  orders of the defined types were placed in this encounter.     Procedures: No procedures performed   Clinical Data: No additional findings.   Subjective: Chief Complaint  Patient presents with   Right Knee - Follow-up    HPI Donald Rhodes is a 48 year old gentleman who comes in for follow-up evaluation of chronic severe right knee pain.  He has been able to obtain stable housing. Review of Systems  Constitutional: Negative.   HENT: Negative.    Eyes: Negative.   Respiratory: Negative.    Cardiovascular: Negative.   Gastrointestinal: Negative.   Endocrine: Negative.   Genitourinary: Negative.   Musculoskeletal:  Positive for arthralgias and gait problem.  Skin: Negative.   Allergic/Immunologic: Negative.   Hematological: Negative.   Psychiatric/Behavioral: Negative.    All other systems reviewed and are negative.    Objective: Vital Signs: There were no vitals taken for this visit.  Physical Exam Vitals and nursing note reviewed.  Constitutional:      Appearance: He is well-developed.  HENT:     Head: Normocephalic and atraumatic.  Eyes:     Pupils: Pupils are equal, round, and reactive to light.  Pulmonary:     Effort: Pulmonary effort is normal.  Abdominal:     Palpations: Abdomen is soft.  Musculoskeletal:        General: Normal range of motion.     Cervical back: Neck supple.  Skin:  General: Skin is warm.  Neurological:     Mental Status: He is alert and oriented to person, place, and time.  Psychiatric:        Behavior: Behavior normal.        Thought Content: Thought content normal.        Judgment: Judgment normal.     Ortho Exam Examination right knee shows severe pain with attempted range of motion.  He allows me to move the knee within about 40 to 90 degrees.  He walks with a cane and with the knee flexed. Specialty Comments:  No specialty comments available.  Imaging: XR KNEE 3 VIEW RIGHT Result Date: 09/12/2023 X-rays of the right knee  show flexion contracture.  Severe tricompartmental degenerative joint disease with bone-on-bone joint space narrowing.  The medial femoral condyle is eroding into the medial tibial plateau.    PMFS History: There are no active problems to display for this patient.  Past Medical History:  Diagnosis Date   Irregular heart beat    Rheumatoid arthritis (HCC)     No family history on file.  History reviewed. No pertinent surgical history. Social History   Occupational History   Not on file  Tobacco Use   Smoking status: Every Day    Current packs/day: 0.50    Types: Cigars, Cigarettes    Passive exposure: Current   Smokeless tobacco: Never  Vaping Use   Vaping status: Never Used  Substance and Sexual Activity   Alcohol use: No   Drug use: No   Sexual activity: Not on file

## 2023-09-17 ENCOUNTER — Telehealth: Payer: Self-pay | Admitting: Family

## 2023-09-17 NOTE — Telephone Encounter (Signed)
A document form has been faxed: Surgical Clearance, to be filled out by provider. Send document back via Fax within 5-days. Document is located in providers tray at front office.           Fax number:  [phone] or to VOZ:[phone]

## 2023-09-19 NOTE — Telephone Encounter (Signed)
Have paperwork in folder.

## 2024-03-11 ENCOUNTER — Ambulatory Visit
Admission: EM | Admit: 2024-03-11 | Discharge: 2024-03-11 | Disposition: A | Discharge status: Home / Self Care | Attending: Physician Assistant | Admitting: Physician Assistant

## 2024-03-11 ENCOUNTER — Other Ambulatory Visit: Payer: Self-pay

## 2024-03-11 ENCOUNTER — Encounter (HOSPITAL_COMMUNITY): Payer: Self-pay

## 2024-03-11 ENCOUNTER — Emergency Department (HOSPITAL_COMMUNITY)
Admission: EM | Admit: 2024-03-11 | Discharge: 2024-03-11 | Disposition: A | Discharge status: Home / Self Care | Source: Ambulatory Visit | Attending: Emergency Medicine | Admitting: Emergency Medicine

## 2024-03-11 ENCOUNTER — Emergency Department (HOSPITAL_COMMUNITY)

## 2024-03-11 DIAGNOSIS — R112 Nausea with vomiting, unspecified: Secondary | ICD-10-CM

## 2024-03-11 DIAGNOSIS — F17200 Nicotine dependence, unspecified, uncomplicated: Secondary | ICD-10-CM | POA: Diagnosis not present

## 2024-03-11 LAB — URINALYSIS, ROUTINE W REFLEX MICROSCOPIC
Bacteria, UA: NONE SEEN
Bilirubin Urine: NEGATIVE
Glucose, UA: NEGATIVE mg/dL
Hgb urine dipstick: NEGATIVE
Ketones, ur: 5 mg/dL — AB
Leukocytes,Ua: NEGATIVE
Nitrite: NEGATIVE
Protein, ur: 30 mg/dL — AB
Specific Gravity, Urine: 1.021 (ref 1.005–1.030)
pH: 6 (ref 5.0–8.0)

## 2024-03-11 LAB — COMPREHENSIVE METABOLIC PANEL WITH GFR
ALT: 9 U/L (ref 0–44)
AST: 16 U/L (ref 15–41)
Albumin: 3.5 g/dL (ref 3.5–5.0)
Alkaline Phosphatase: 73 U/L (ref 38–126)
Anion gap: 7 (ref 5–15)
BUN: 15 mg/dL (ref 6–20)
CO2: 24 mmol/L (ref 22–32)
Calcium: 8.4 mg/dL — ABNORMAL LOW (ref 8.9–10.3)
Chloride: 111 mmol/L (ref 98–111)
Creatinine, Ser: 0.52 mg/dL — ABNORMAL LOW (ref 0.61–1.24)
GFR, Estimated: >60 mL/min (ref >60)
Glucose, Bld: 110 mg/dL — ABNORMAL HIGH (ref 70–99)
Potassium: 4 mmol/L (ref 3.5–5.1)
Sodium: 142 mmol/L (ref 135–145)
Total Bilirubin: 0.9 mg/dL (ref 0.0–1.2)
Total Protein: 5.8 g/dL — ABNORMAL LOW (ref 6.5–8.1)

## 2024-03-11 LAB — CBC
HCT: 45.9 % (ref 39.0–52.0)
Hemoglobin: 14.7 g/dL (ref 13.0–17.0)
MCH: 28.5 pg (ref 26.0–34.0)
MCHC: 32 g/dL (ref 30.0–36.0)
MCV: 89.1 fL (ref 80.0–100.0)
Platelets: 248 K/uL (ref 150–400)
RBC: 5.15 MIL/uL (ref 4.22–5.81)
RDW: 13.6 % (ref 11.5–15.5)
WBC: 8.5 K/uL (ref 4.0–10.5)
nRBC: 0 % (ref 0.0–0.2)

## 2024-03-11 LAB — LIPASE, BLOOD: Lipase: 24 U/L (ref 11–51)

## 2024-03-11 MED ORDER — SODIUM CHLORIDE 0.9 % IV SOLN: INTRAVENOUS | Status: DC

## 2024-03-11 MED ORDER — PANTOPRAZOLE SODIUM 40 MG IV SOLR
40.0000 mg | Freq: Once | INTRAVENOUS | Status: AC
  Administered 2024-03-11: 40 mg via INTRAVENOUS
  Filled 2024-03-11: qty 10

## 2024-03-11 MED ORDER — SODIUM CHLORIDE 0.9 % IV BOLUS
1000.0000 mL | Freq: Once | INTRAVENOUS | Status: AC
  Administered 2024-03-11: 1000 mL via INTRAVENOUS

## 2024-03-11 MED ORDER — PROMETHAZINE HCL 25 MG PO TABS
25.0000 mg | ORAL_TABLET | Freq: Four times a day (QID) | ORAL | 0 refills | Status: AC | PRN
  Filled 2024-03-11: qty 30, 8d supply, fill #0

## 2024-03-11 MED ORDER — ONDANSETRON 4 MG PO TBDP
4.0000 mg | ORAL_TABLET | Freq: Once | ORAL | Status: AC
  Administered 2024-03-11: 4 mg via ORAL

## 2024-03-11 MED ORDER — SODIUM CHLORIDE 0.9 % IV SOLN
12.5000 mg | Freq: Four times a day (QID) | INTRAVENOUS | Status: DC | PRN
  Administered 2024-03-11: 12.5 mg via INTRAVENOUS
  Filled 2024-03-11: qty 12.5

## 2024-03-11 MED ORDER — ONDANSETRON HCL 4 MG/2ML IJ SOLN
4.0000 mg | Freq: Once | INTRAMUSCULAR | Status: AC
  Administered 2024-03-11: 4 mg via INTRAVENOUS
  Filled 2024-03-11: qty 2

## 2024-03-11 NOTE — ED Triage Notes (Signed)
 Pt BIB [facility] from urgent care. Pt reports N/V for 3 days that is white. Unable to hold down any food/ drink. Denies diarrhea and abdominal pain, Hx stomach ulcers.   EMS 20 LFA 4 mg Zofran  CBG 118

## 2024-03-11 NOTE — ED Notes (Signed)
 EMS called for transport. Pt prefers to wait in lobby.

## 2024-03-11 NOTE — ED Notes (Signed)
 Pt vomited after zofran , provider made aware

## 2024-03-11 NOTE — ED Provider Notes (Signed)
 [name]    CSN: [id number] Arrival date & time: 03/11/24  0820      History   Chief Complaint Chief Complaint  Patient presents with   Emesis    HPI Donald Rhodes is a 48 y.o. male.   Patient presents today for evaluation of nausea and vomiting he has had the last 2-3 days. He feels symptoms may be related to eating a chili dog that was from a [employer]. He denies diarrhea. He has felt hot/cold. He is homeless. He has history of RA.   The history is provided by the patient.  Emesis Associated symptoms: no chills, no diarrhea and no fever     Past Medical History:  Diagnosis Date   Irregular heart beat    Rheumatoid arthritis (HCC)     There are no active problems to display for this patient.   No past surgical history on file.     Home Medications    Prior to Admission medications   Medication Sig Start Date End Date Taking? Authorizing Provider  naproxen  sodium (ALEVE ) 220 MG tablet Take 220 mg by mouth daily as needed.   Yes [provider]  acetaminophen  (TYLENOL ) 500 MG tablet Take 1 tablet (500 mg total) by mouth every 6 (six) hours as needed. 07/31/22   Lorren Greig PARAS, NP  azithromycin  (ZITHROMAX ) 250 MG tablet Take 1 tablet (250 mg total) by mouth daily. Take first 2 tablets together, then 1 every day until finished. Patient not taking: Reported on 03/11/2024 06/20/23   Billy Asberry FALCON, [name]  cyclobenzaprine  (FLEXERIL ) 5 MG tablet Take 1 tablet (5 mg total) by mouth 3 (three) times daily as needed for muscle spasms. 08/30/22   Lorren Greig PARAS, NP  ibuprofen  (ADVIL ) 200 MG tablet Take 800 mg by mouth every 6 (six) hours as needed for mild pain (pain score 1-3).    [provider]  meloxicam  (MOBIC ) 7.5 MG tablet Take 7.5 mg by mouth daily.    [provider]  ondansetron  (ZOFRAN -ODT) 4 MG disintegrating tablet Take 1 tablet (4 mg total) by mouth every 8 (eight) hours as needed. 07/10/23   Hildegard, [name], [name]   pantoprazole  (PROTONIX ) 40 MG tablet Take 1 tablet (40 mg total) by mouth daily. Patient not taking: Reported on 03/11/2024 07/10/23   Hildegard Loge, [name]    Family History No family history on file.  Social History Social History   Tobacco Use   Smoking status: Every Day    Current packs/day: 0.50    Types: Cigars, Cigarettes    Passive exposure: Current   Smokeless tobacco: Never  Vaping Use   Vaping status: Never Used  Substance Use Topics   Alcohol use: No   Drug use: No     Allergies   Patient has no known allergies.   Review of Systems Review of Systems  Constitutional:  Negative for chills and fever.  Eyes:  Negative for discharge and redness.  Respiratory:  Negative for shortness of breath.   Gastrointestinal:  Positive for nausea and vomiting. Negative for diarrhea.  Neurological:  Negative for numbness.     Physical Exam Triage Vital Signs ED Triage Vitals [03/11/24 0837]  Encounter Vitals Group     BP      Girls Systolic BP Percentile      Girls Diastolic BP Percentile      Boys Systolic BP Percentile      Boys Diastolic BP Percentile      Pulse  Resp      Temp      Temp src      SpO2      Weight      Height      Head Circumference      Peak Flow      Pain Score 0     Pain Loc      Pain Education      Exclude from Growth Chart    No data found.  Updated Vital Signs BP 133/77 (BP Location: Right Arm)   Pulse (!) 55   Temp 97.7 F (36.5 C) (Oral)   Resp 20   SpO2 99%   Visual Acuity Right Eye Distance:   Left Eye Distance:   Bilateral Distance:    Right Eye Near:   Left Eye Near:    Bilateral Near:     Physical Exam Vitals and nursing note reviewed.  Constitutional:      Appearance: He is ill-appearing.     Comments: Appears frail, questionable mild jaundice  HENT:     Head: Normocephalic and atraumatic.  Eyes:     Conjunctiva/sclera: Conjunctivae normal.  Cardiovascular:     Rate and Rhythm: Regular rhythm.  Bradycardia present.  Pulmonary:     Effort: Pulmonary effort is normal. No respiratory distress.     Breath sounds: No wheezing, rhonchi or rales.  Neurological:     Mental Status: He is alert.  Psychiatric:        Mood and Affect: Mood normal.        Behavior: Behavior normal.        Thought Content: Thought content normal.      UC Treatments / Results  Labs (all labs ordered are listed, but only abnormal results are displayed) Labs Reviewed - No data to display  EKG   Radiology No results found.  Procedures Procedures (including critical care time)  Medications Ordered in UC Medications  ondansetron  (ZOFRAN -ODT) disintegrating tablet 4 mg (4 mg Oral Given 03/11/24 0835)    Initial Impression / Assessment and Plan / UC Course  I have reviewed the triage vital signs and the nursing notes.  Pertinent labs & imaging results that were available during my care of the patient were reviewed by me and considered in my medical decision making (see chart for details).    Patient did not tolerate ODT zofran . Recommended further evaluation in the ED. Patient is hesitant because he is concerned about the wait. He was informed that we would not have ability to obtain necessary labs, etc in UC setting. He is ultimately agreeable to EMS transport. He does refuse to stay in the clincal room and reports I need to get out of this room. He await EMS in UC lobby.   Final Clinical Impressions(s) / UC Diagnoses   Final diagnoses:  Nausea and vomiting, unspecified vomiting type   Discharge Instructions   None    ED Prescriptions   None    PDMP not reviewed this encounter.   Billy Asberry FALCON, [name] 03/11/24 [phone]

## 2024-03-11 NOTE — ED Notes (Signed)
 Patient keeps calling out demanding food and drink. Patient told several times that he is Npo and can not have anything yet.

## 2024-03-11 NOTE — ED Notes (Signed)
 Patient requesting nausea medication. Dr. Zackowski notified.

## 2024-03-11 NOTE — Discharge Instructions (Addendum)
 Take the Phenergan  as directed.  Prescription sent to your pharmacy.  Return for any new or worse symptoms.  X-ray and labs here all without significant abnormalities very reassuring.

## 2024-03-11 NOTE — ED Notes (Signed)
 Patient is being discharged from the Urgent Care and sent to the Emergency Department via EMS . Per Asberry Senters, [name], patient is in need of higher level of care due to vomiting for 3 days. Patient is aware and verbalizes understanding of plan of care.  Vitals:   03/11/24 0840  BP: 133/77  Pulse: (!) 55  Resp: 20  Temp: 97.7 F (36.5 C)  SpO2: 99%

## 2024-03-11 NOTE — ED Triage Notes (Addendum)
 Pt reports n/v since Monday. Denies diarrhea. Thinks it may be related to eating a bad chili dog. Requesting zofran  for nausea. States he has taken it before. Also reports feeling hot/cold also. He is here with his service dog.

## 2024-03-11 NOTE — ED Provider Notes (Addendum)
 [place] EMERGENCY DEPARTMENT AT [facility] Provider Note   CSN: [id number] Arrival date & time: 03/11/24  1015     Patient presents with: Nausea and Emesis   Donald Rhodes is a 48 y.o. male.   Patient sent in from urgent care.  Patient with 3 days of nausea and vomiting.  Patient states she has had a history of stomach ulcers but has not vomited any blood.  Not able to keep anything down.  No abdominal pain no diarrhea.  EMS did give him 4 mg of Zofran  blood sugar was 118.  Temperature 97.3 pulse 55 respirations 18 blood pressure 140/83 oxygen saturations at 100% on room air.  Past medical history significant for irregular heartbeat rheumatoid arthritis everyday smoker.  Patient is here with his service dog.  Service dog's name is Con.       Prior to Admission medications   Medication Sig Start Date End Date Taking? Authorizing Provider  acetaminophen  (TYLENOL ) 500 MG tablet Take 1 tablet (500 mg total) by mouth every 6 (six) hours as needed. 07/31/22   Lorren Greig PARAS, NP  azithromycin  (ZITHROMAX ) 250 MG tablet Take 1 tablet (250 mg total) by mouth daily. Take first 2 tablets together, then 1 every day until finished. Patient not taking: Reported on 03/11/2024 06/20/23   Billy Asberry FALCON, [name]  cyclobenzaprine  (FLEXERIL ) 5 MG tablet Take 1 tablet (5 mg total) by mouth 3 (three) times daily as needed for muscle spasms. 08/30/22   Lorren Greig PARAS, NP  ibuprofen  (ADVIL ) 200 MG tablet Take 800 mg by mouth every 6 (six) hours as needed for mild pain (pain score 1-3).    [provider]  meloxicam  (MOBIC ) 7.5 MG tablet Take 7.5 mg by mouth daily.    [provider]  naproxen  sodium (ALEVE ) 220 MG tablet Take 220 mg by mouth daily as needed.    [provider]  ondansetron  (ZOFRAN -ODT) 4 MG disintegrating tablet Take 1 tablet (4 mg total) by mouth every 8 (eight) hours as needed. 07/10/23   Hildegard, [name], [name]  pantoprazole  (PROTONIX ) 40 MG tablet  Take 1 tablet (40 mg total) by mouth daily. Patient not taking: Reported on 03/11/2024 07/10/23   Hildegard Loge, [name]    Allergies: Patient has no known allergies.    Review of Systems  Constitutional:  Negative for chills and fever.  HENT:  Negative for ear pain and sore throat.   Eyes:  Negative for pain and visual disturbance.  Respiratory:  Negative for cough and shortness of breath.   Cardiovascular:  Negative for chest pain and palpitations.  Gastrointestinal:  Positive for nausea and vomiting. Negative for abdominal pain, blood in stool and diarrhea.  Genitourinary:  Negative for dysuria and hematuria.  Musculoskeletal:  Negative for arthralgias and back pain.  Skin:  Negative for color change and rash.  Neurological:  Negative for seizures and syncope.  All other systems reviewed and are negative.   Updated Vital Signs BP (!) 140/83 (BP Location: Right Arm)   Pulse (!) 55   Temp (!) 97.3 F (36.3 C) (Oral)   Resp 18   Ht 1.676 m (5' 6)   Wt 52.2 kg   SpO2 100%   BMI 18.56 kg/m   Physical Exam Vitals and nursing note reviewed.  Constitutional:      General: He is not in acute distress.    Appearance: Normal appearance. He is well-developed. He is not ill-appearing.  HENT:     Head:  Normocephalic and atraumatic.     Mouth/Throat:     Mouth: Mucous membranes are dry.  Eyes:     Conjunctiva/sclera: Conjunctivae normal.  Cardiovascular:     Rate and Rhythm: Normal rate and regular rhythm.     Heart sounds: No murmur heard. Pulmonary:     Effort: Pulmonary effort is normal. No respiratory distress.     Breath sounds: Normal breath sounds.  Abdominal:     General: There is no distension.     Palpations: Abdomen is soft.     Tenderness: There is no abdominal tenderness. There is no guarding.  Musculoskeletal:        General: No swelling.     Cervical back: Normal range of motion and neck supple. No rigidity.  Skin:    General: Skin is warm and dry.     Capillary  Refill: Capillary refill takes less than 2 seconds.  Neurological:     General: No focal deficit present.     Mental Status: He is alert and oriented to person, place, and time.  Psychiatric:        Mood and Affect: Mood normal.     (all labs ordered are listed, but only abnormal results are displayed) Labs Reviewed  COMPREHENSIVE METABOLIC PANEL WITH GFR - Abnormal; Notable for the following components:      Result Value   Glucose, Bld 110 (*)    Creatinine, Ser 0.52 (*)    Calcium 8.4 (*)    Total Protein 5.8 (*)    All other components within normal limits  LIPASE, BLOOD  CBC  URINALYSIS, ROUTINE W REFLEX MICROSCOPIC    EKG: None  Radiology: No results found.   Procedures   Medications Ordered in the ED  ondansetron  (ZOFRAN ) injection 4 mg (has no administration in time range)  pantoprazole  (PROTONIX ) injection 40 mg (has no administration in time range)  0.9 %  sodium chloride  infusion (has no administration in time range)  sodium chloride  0.9 % bolus 1,000 mL (1,000 mLs Intravenous New Bag/Given 03/11/24 1142)                                    Medical Decision Making Amount and/or Complexity of Data Reviewed Labs: ordered. Radiology: ordered.  Risk Prescription drug management.   Patient appears dehydrated.  Abdomen soft nontender denies vomiting of any blood.  Will give Protonix  will give Zofran  repeat dose from EMS will give a liter of fluid and start IV hydration.  Patient's labs are coming back.  Lipase normal at 24 which is reassuring complete metabolic panel GFR is greater than 60 LFTs are normal electrolytes are normal.  CBC no white blood cell count elevation is 8.5.  Hemoglobin 14.7 platelets 248.  At this point based on his abdominal exam do not sure if he needs CT scan abdomen and pelvis since abdomen is fairly soft and nontender.  Will also get acute abdominal series.  Will reassess after the fluids and the additional antinausea  medicine.  Patient's labs very reassuring.  Acute abdominal series without any acute findings.  If patient is feeling better and and nausea is better controlled could be discharged.  Patient's urinalysis is back.  No evidence of any UTI.  Double check on patient may need some Phenergan  for discharge to help with the nausea and vomiting.  Suspect he can be discharged.  Will give patient prescription for Phenergan .  And he is dischargeable.  Final diagnoses:  Nausea and vomiting, unspecified vomiting type    ED Discharge Orders     None          Geraldene Hamilton, MD 03/11/24 1142    Geraldene Hamilton, MD 03/11/24 8762    Geraldene Hamilton, MD 03/11/24 1520

## 2024-03-12 ENCOUNTER — Other Ambulatory Visit: Payer: Self-pay

## 2024-03-12 ENCOUNTER — Other Ambulatory Visit (HOSPITAL_COMMUNITY): Payer: Self-pay

## 2024-03-13 ENCOUNTER — Telehealth: Payer: Self-pay | Admitting: Emergency Medicine

## 2024-03-13 NOTE — Telephone Encounter (Signed)
 Pt came in office with questions about meds that were sent to the pharmacy. Confirmed the meds were sent. No further action required.

## 2024-03-15 ENCOUNTER — Other Ambulatory Visit: Payer: Self-pay

## 2024-03-15 ENCOUNTER — Encounter (HOSPITAL_COMMUNITY): Payer: Self-pay | Admitting: *Deleted

## 2024-03-15 ENCOUNTER — Emergency Department (HOSPITAL_COMMUNITY)
Admission: EM | Admit: 2024-03-15 | Discharge: 2024-03-16 | Disposition: A | Discharge status: Home / Self Care | Attending: Emergency Medicine | Admitting: Emergency Medicine

## 2024-03-15 DIAGNOSIS — R112 Nausea with vomiting, unspecified: Secondary | ICD-10-CM

## 2024-03-15 DIAGNOSIS — K2900 Acute gastritis without bleeding: Secondary | ICD-10-CM | POA: Insufficient documentation

## 2024-03-15 DIAGNOSIS — E876 Hypokalemia: Secondary | ICD-10-CM | POA: Insufficient documentation

## 2024-03-15 DIAGNOSIS — Z5900 Homelessness unspecified: Secondary | ICD-10-CM | POA: Insufficient documentation

## 2024-03-15 LAB — CBC
HCT: 48 % (ref 39.0–52.0)
Hemoglobin: 16.2 g/dL (ref 13.0–17.0)
MCH: 29.2 pg (ref 26.0–34.0)
MCHC: 33.8 g/dL (ref 30.0–36.0)
MCV: 86.6 fL (ref 80.0–100.0)
Platelets: 318 K/uL (ref 150–400)
RBC: 5.54 MIL/uL (ref 4.22–5.81)
RDW: 13.3 % (ref 11.5–15.5)
WBC: 9.2 K/uL (ref 4.0–10.5)
nRBC: 0 % (ref 0.0–0.2)

## 2024-03-15 LAB — LIPASE, BLOOD: Lipase: 24 U/L (ref 11–51)

## 2024-03-15 LAB — COMPREHENSIVE METABOLIC PANEL WITH GFR
ALT: 12 U/L (ref 0–44)
AST: 15 U/L (ref 15–41)
Albumin: 4.2 g/dL (ref 3.5–5.0)
Alkaline Phosphatase: 78 U/L (ref 38–126)
Anion gap: 15 (ref 5–15)
BUN: 14 mg/dL (ref 6–20)
CO2: 26 mmol/L (ref 22–32)
Calcium: 9.3 mg/dL (ref 8.9–10.3)
Chloride: 100 mmol/L (ref 98–111)
Creatinine, Ser: 0.92 mg/dL (ref 0.61–1.24)
GFR, Estimated: >60 mL/min (ref >60)
Glucose, Bld: 112 mg/dL — ABNORMAL HIGH (ref 70–99)
Potassium: 3.4 mmol/L — ABNORMAL LOW (ref 3.5–5.1)
Sodium: 141 mmol/L (ref 135–145)
Total Bilirubin: 1.4 mg/dL — ABNORMAL HIGH (ref 0.0–1.2)
Total Protein: 6.7 g/dL (ref 6.5–8.1)

## 2024-03-15 MED ORDER — ONDANSETRON 4 MG PO TBDP: ORAL_TABLET | ORAL | 0 refills | Status: AC

## 2024-03-15 MED ORDER — PANTOPRAZOLE SODIUM 40 MG IV SOLR
40.0000 mg | Freq: Once | INTRAVENOUS | Status: AC
  Administered 2024-03-15: 40 mg via INTRAVENOUS
  Filled 2024-03-15: qty 10

## 2024-03-15 MED ORDER — PANTOPRAZOLE SODIUM 20 MG PO TBEC: 20.0000 mg | DELAYED_RELEASE_TABLET | Freq: Every day | ORAL | 0 refills | Status: AC

## 2024-03-15 MED ORDER — ONDANSETRON HCL 4 MG/2ML IJ SOLN
4.0000 mg | Freq: Once | INTRAMUSCULAR | Status: AC
  Administered 2024-03-15: 4 mg via INTRAVENOUS
  Filled 2024-03-15: qty 2

## 2024-03-15 MED ORDER — LIDOCAINE VISCOUS HCL 2 % MT SOLN: 15.0000 mL | Freq: Once | OROMUCOSAL | Status: DC

## 2024-03-15 MED ORDER — SUCRALFATE 1 G PO TABS: 1.0000 g | ORAL_TABLET | Freq: Three times a day (TID) | ORAL | 0 refills | Status: AC

## 2024-03-15 MED ORDER — SODIUM CHLORIDE 0.9 % IV BOLUS
1000.0000 mL | Freq: Once | INTRAVENOUS | Status: AC
  Administered 2024-03-15: 1000 mL via INTRAVENOUS

## 2024-03-15 MED ORDER — ALUM & MAG HYDROXIDE-SIMETH 200-200-20 MG/5ML PO SUSP
30.0000 mL | Freq: Once | ORAL | Status: AC
  Administered 2024-03-15: 30 mL via ORAL
  Filled 2024-03-15: qty 30

## 2024-03-15 NOTE — ED Triage Notes (Signed)
 The pt eas seen at [facility] ed Wednesday for nausea and vomiting and abd pain  he reports that It has not gone away

## 2024-03-15 NOTE — ED Provider Notes (Addendum)
 [place] EMERGENCY DEPARTMENT AT [facility] Provider Note   CSN: [id number] Arrival date & time: 03/15/24  8162     Patient presents with: Abdominal Pain   Donald Rhodes is a 48 y.o. male.   Patient is a 48 year old male who presents with nausea and vomiting.  He states he has a history of rheumatoid arthritis and takes a lot of Aleve .  He says he was told about 6 months ago that he had an ulcer.  He says sometimes it flares up, likely from the Aleve .  He says over the last week he has had some nausea and vomiting and burning in his chest.  He denies any abdominal pain.  He feels like it similar to the symptoms he has had before with his ulcer.  He denies any hematemesis.  No dizziness.  No noticeable blood in his stool.  No change in his stools.  He has been seen in the ED recently for similar symptoms.       Prior to Admission medications   Medication Sig Start Date End Date Taking? Authorizing Provider  ondansetron  (ZOFRAN -ODT) 4 MG disintegrating tablet 4mg  ODT q4 hours prn nausea/vomit 03/15/24  Yes Lenor Hollering, MD  pantoprazole  (PROTONIX ) 20 MG tablet Take 1 tablet (20 mg total) by mouth daily. 03/15/24  Yes Lenor Hollering, MD  sucralfate  (CARAFATE ) 1 g tablet Take 1 tablet (1 g total) by mouth 4 (four) times daily -  with meals and at bedtime. 03/15/24  Yes Lenor Hollering, MD  acetaminophen  (TYLENOL ) 500 MG tablet Take 1 tablet (500 mg total) by mouth every 6 (six) hours as needed. 07/31/22   Lorren Greig PARAS, NP  azithromycin  (ZITHROMAX ) 250 MG tablet Take 1 tablet (250 mg total) by mouth daily. Take first 2 tablets together, then 1 every day until finished. Patient not taking: Reported on 03/11/2024 06/20/23   Billy Asberry FALCON, [name]  cyclobenzaprine  (FLEXERIL ) 5 MG tablet Take 1 tablet (5 mg total) by mouth 3 (three) times daily as needed for muscle spasms. 08/30/22   Lorren Greig PARAS, NP  ibuprofen  (ADVIL ) 200 MG tablet Take 800 mg by mouth every 6 (six) hours as  needed for mild pain (pain score 1-3).    [provider]  meloxicam  (MOBIC ) 7.5 MG tablet Take 7.5 mg by mouth daily.    [provider]  naproxen  sodium (ALEVE ) 220 MG tablet Take 220 mg by mouth daily as needed.    [provider]  promethazine  (PHENERGAN ) 25 MG tablet Take 1 tablet (25 mg total) by mouth every 6 (six) hours as needed for nausea or vomiting. 03/11/24   Zackowski, Scott, MD    Allergies: Patient has no known allergies.    Review of Systems  Constitutional:  Negative for chills, diaphoresis, fatigue and fever.  HENT:  Negative for congestion, rhinorrhea and sneezing.   Eyes: Negative.   Respiratory:  Negative for cough, chest tightness and shortness of breath.   Cardiovascular:  Negative for chest pain and leg swelling.  Gastrointestinal:  Positive for nausea and vomiting. Negative for abdominal pain, blood in stool and diarrhea.  Genitourinary:  Negative for difficulty urinating, flank pain, frequency and hematuria.  Musculoskeletal:  Positive for arthralgias (Chronic). Negative for back pain.  Skin:  Negative for rash.  Neurological:  Negative for dizziness, speech difficulty, weakness, numbness and headaches.    Updated Vital Signs BP 125/78   Pulse 68   Temp 98.1 F (36.7 C)   Resp 19  Ht 5' 6 (1.676 m)   Wt 52.2 kg   SpO2 97%   BMI 18.57 kg/m   Physical Exam Constitutional:      Appearance: He is well-developed.  HENT:     Head: Normocephalic and atraumatic.  Eyes:     Pupils: Pupils are equal, round, and reactive to light.  Cardiovascular:     Rate and Rhythm: Normal rate and regular rhythm.     Heart sounds: Normal heart sounds.  Pulmonary:     Effort: Pulmonary effort is normal. No respiratory distress.     Breath sounds: Normal breath sounds. No wheezing or rales.  Chest:     Chest wall: No tenderness.  Abdominal:     General: Bowel sounds are normal.     Palpations: Abdomen is soft.     Tenderness: There is no  abdominal tenderness. There is no guarding or rebound.  Musculoskeletal:        General: Normal range of motion.     Cervical back: Normal range of motion and neck supple.  Lymphadenopathy:     Cervical: No cervical adenopathy.  Skin:    General: Skin is warm and dry.     Findings: No rash.  Neurological:     Mental Status: He is alert and oriented to person, place, and time.     (all labs ordered are listed, but only abnormal results are displayed) Labs Reviewed  COMPREHENSIVE METABOLIC PANEL WITH GFR - Abnormal; Notable for the following components:      Result Value   Potassium 3.4 (*)    Glucose, Bld 112 (*)    Total Bilirubin 1.4 (*)    All other components within normal limits  CBC  LIPASE, BLOOD    EKG: None  Radiology: No results found.   Procedures   Medications Ordered in the ED  pantoprazole  (PROTONIX ) injection 40 mg (40 mg Intravenous Given 03/15/24 2026)  ondansetron  (ZOFRAN ) injection 4 mg (4 mg Intravenous Given 03/15/24 2024)  sodium chloride  0.9 % bolus 1,000 mL (0 mLs Intravenous Stopped 03/15/24 2210)  alum & mag hydroxide-simeth (MAALOX/MYLANTA) 200-200-20 MG/5ML suspension 30 mL (30 mLs Oral Given 03/15/24 2206)                                    Medical Decision Making Risk OTC drugs. Prescription drug management.   This patient presents to the ED for concern of nausea and vomiting, this involves an extensive number of treatment options, and is a complaint that carries with it a high risk of complications and morbidity.  I considered the following differential and admission for this acute, potentially life threatening condition.  The differential diagnosis includes gastritis, pancreatitis, cholecystitis, gastroenteritis, bowel obstruction  MDM:    Patient is a 48 year old who presents with nausea and vomiting.  He does not have any hematemesis.  Labs are reviewed and are nonconcerning.  He does not have any associated abdominal pain.  He does  have some burning up into his throat that seems to be consistent with bad GERD.  He is not currently on any antacid type medicines.  He is much better after treatment in the ED.  He is able to keep down liquids without ongoing vomiting.  He does not have any symptoms that would be more concerning for ACS.  No suggestions of pancreatitis.  No abdominal pain that would warrant imaging.  He was discharged home in  good condition.  Was given a prescription for Zofran , Protonix  and Carafate .  Was given information about make an appointment with gastroenterology.  Return precautions were given.  (Labs, imaging, consults)  Labs: I Ordered, and personally interpreted labs.  The pertinent results include: Minimally low potassium, normal LFTs, normal lipase  Imaging Studies ordered: I ordered imaging studies including none  Additional history obtained from chart.  External records from outside source obtained and reviewed including prior notes  Cardiac Monitoring: The patient was maintained on a cardiac monitor.  If on the cardiac monitor, I personally viewed and interpreted the cardiac monitored which showed an underlying rhythm of: Sinus rhythm  Reevaluation: After the interventions noted above, I reevaluated the patient and found that they have :improved  Social Determinants of Health:  homeless  Disposition: Discharged  Co morbidities that complicate the patient evaluation  Past Medical History:  Diagnosis Date   Irregular heart beat    Rheumatoid arthritis (HCC)      Medicines Meds ordered this encounter  Medications   pantoprazole  (PROTONIX ) injection 40 mg   ondansetron  (ZOFRAN ) injection 4 mg   sodium chloride  0.9 % bolus 1,000 mL   alum & mag hydroxide-simeth (MAALOX/MYLANTA) 200-200-20 MG/5ML suspension 30 mL   DISCONTD: lidocaine  (XYLOCAINE ) 2 % viscous mouth solution 15 mL   pantoprazole  (PROTONIX ) 20 MG tablet    Sig: Take 1 tablet (20 mg total) by mouth daily.    Dispense:   30 tablet    Refill:  0   sucralfate  (CARAFATE ) 1 g tablet    Sig: Take 1 tablet (1 g total) by mouth 4 (four) times daily -  with meals and at bedtime.    Dispense:  30 tablet    Refill:  0   ondansetron  (ZOFRAN -ODT) 4 MG disintegrating tablet    Sig: 4mg  ODT q4 hours prn nausea/vomit    Dispense:  8 tablet    Refill:  0    I have reviewed the patients home medicines and have made adjustments as needed  Problem List / ED Course: Problem List Items Addressed This Visit   None Visit Diagnoses       Acute gastritis without hemorrhage, unspecified gastritis type    -  Primary     Nausea and vomiting, unspecified vomiting type                    Final diagnoses:  Acute gastritis without hemorrhage, unspecified gastritis type  Nausea and vomiting, unspecified vomiting type    ED Discharge Orders          Ordered    pantoprazole  (PROTONIX ) 20 MG tablet  Daily        03/15/24 2315    sucralfate  (CARAFATE ) 1 g tablet  3 times daily with meals & bedtime        03/15/24 2315    ondansetron  (ZOFRAN -ODT) 4 MG disintegrating tablet        03/15/24 2315               Lenor Hollering, MD 03/15/24 [id number]    Lenor Hollering, MD 03/15/24 2319

## 2024-03-15 NOTE — Discharge Instructions (Signed)
 Start taking the medicines as discussed.  Return to the emergency room if you have any worsening symptoms.

## 2024-08-10 ENCOUNTER — Other Ambulatory Visit (HOSPITAL_COMMUNITY): Payer: Self-pay
